# Patient Record
Sex: Female | Born: 1985 | Marital: Married | Smoking: Never smoker
Health system: Southern US, Community
[De-identification: ages and names within clinical notes are randomized; demographics above are authoritative.]

## PROBLEM LIST (undated history)

## (undated) ENCOUNTER — Inpatient Hospital Stay (HOSPITAL_COMMUNITY): Payer: Self-pay

## (undated) DIAGNOSIS — O24419 Gestational diabetes mellitus in pregnancy, unspecified control: Secondary | ICD-10-CM

## (undated) HISTORY — DX: Gestational diabetes mellitus in pregnancy, unspecified control: O24.419

## (undated) HISTORY — PX: NO PAST SURGERIES: SHX2092

---

## 2017-05-28 NOTE — L&D Delivery Note (Addendum)
Delivery Note At 4:29 AM a viable female was delivered via Vaginal, Spontaneous (Presentation: OA ).  APGAR: pending, ; weight pending.   Placenta status: intact.  Cord: 3 vessel  with the following complications: precipitous delivery .  Cord pH: not obtained  Anesthesia:  none Episiotomy: None Lacerations: None Suture Repair: none Est. Blood Loss (mL): 100  Mom to postpartum.  Baby to Couplet care / Skin to Skin.  Cassidy Gordon 12/01/2017, 4:40 AM

## 2017-05-30 ENCOUNTER — Ambulatory Visit: Payer: Self-pay

## 2017-06-05 ENCOUNTER — Ambulatory Visit (INDEPENDENT_AMBULATORY_CARE_PROVIDER_SITE_OTHER): Payer: PPO | Admitting: General Practice

## 2017-06-05 DIAGNOSIS — Z3201 Encounter for pregnancy test, result positive: Secondary | ICD-10-CM

## 2017-06-05 DIAGNOSIS — O219 Vomiting of pregnancy, unspecified: Secondary | ICD-10-CM

## 2017-06-05 LAB — POCT PREGNANCY, URINE: PREG TEST UR: POSITIVE — AB

## 2017-06-05 MED ORDER — RANITIDINE HCL 150 MG PO TABS: 150.0000 mg | ORAL_TABLET | Freq: Two times a day (BID) | ORAL | 0 refills | Status: DC

## 2017-06-05 MED ORDER — DOXYLAMINE-PYRIDOXINE 10-10 MG PO TBEC: DELAYED_RELEASE_TABLET | ORAL | 1 refills | Status: DC

## 2017-06-05 MED ORDER — PRENATAL VITAMINS 0.8 MG PO TABS: 1.0000 | ORAL_TABLET | Freq: Every day | ORAL | 12 refills | Status: DC

## 2017-06-05 NOTE — Progress Notes (Signed)
[employer] interpreter [phone] used for encounter. Patient here for UPT today. UPT +. Patient hasn't taken any tests at home. LMP 03/07/17 EDD 12/12/17 6469w6d. Patient denies taking medication, sometimes folic acid. Recommended she take PNV daily. Patient is requesting Rx for nausea & acid reflux. Per Dr Vergie LivingPickens, may prescribe zantac & diclegis. meds ordered. Informed patient & advised she start prenatal care. Patient verbalized understanding & has no questions

## 2017-06-24 ENCOUNTER — Ambulatory Visit (INDEPENDENT_AMBULATORY_CARE_PROVIDER_SITE_OTHER): Payer: PPO | Admitting: Advanced Practice Midwife

## 2017-06-24 ENCOUNTER — Encounter: Payer: Self-pay | Admitting: Advanced Practice Midwife

## 2017-06-24 DIAGNOSIS — Z8632 Personal history of gestational diabetes: Secondary | ICD-10-CM

## 2017-06-24 DIAGNOSIS — O09292 Supervision of pregnancy with other poor reproductive or obstetric history, second trimester: Secondary | ICD-10-CM

## 2017-06-24 DIAGNOSIS — O09299 Supervision of pregnancy with other poor reproductive or obstetric history, unspecified trimester: Secondary | ICD-10-CM

## 2017-06-24 DIAGNOSIS — O0990 Supervision of high risk pregnancy, unspecified, unspecified trimester: Secondary | ICD-10-CM

## 2017-06-24 DIAGNOSIS — O0992 Supervision of high risk pregnancy, unspecified, second trimester: Secondary | ICD-10-CM

## 2017-06-24 DIAGNOSIS — Z3480 Encounter for supervision of other normal pregnancy, unspecified trimester: Secondary | ICD-10-CM | POA: Insufficient documentation

## 2017-06-24 NOTE — Progress Notes (Signed)
  Subjective:    Cassidy Gordon is being seen today for her first obstetrical visit.  This is a planned pregnancy. She is at [redacted]w[redacted]d gestation. Her obstetrical history is significant for diet controlled GDM with prior pregnancy. . Relationship with FOB: spouse, living together. Patient does intend to breast feed. Pregnancy history fully reviewed.  Patient reports no complaints.  Last pap, unsure, prior to pregnancy in 2016.  First baby born in [place] in 11/2014, NSVD  Review of Systems:   Review of Systems  All other systems reviewed and are negative.   Objective:     BP 123/71   Wt 171 lb 8 oz (77.8 kg)   LMP 03/07/2017 (Exact Date)  Physical Exam  Nursing note and vitals reviewed. Constitutional: She is oriented to person, place, and time. She appears well-developed and well-nourished. No distress.  HENT:  Head: Normocephalic.  Cardiovascular: Normal rate.  Respiratory: Effort normal. Right breast exhibits no inverted nipple, no mass, no nipple discharge, no skin change and no tenderness. Left breast exhibits no inverted nipple, no mass, no nipple discharge, no skin change and no tenderness.  GI: Soft. There is no tenderness. There is no rebound.  Neurological: She is alert and oriented to person, place, and time.  Skin: Skin is warm and dry.  Psychiatric: She has a normal mood and affect.      Fetal Exam Fetal Monitor Review: Mode: hand-held doppler probe.   Baseline rate: 144.         Assessment:    Pregnancy: G2P0 Patient Active Problem List   Diagnosis Date Noted  . Supervision of other normal pregnancy, antepartum 06/24/2017  . History of gestational diabetes in prior pregnancy, currently pregnant 06/24/2017       Plan:     Initial labs drawn. Patient declines pap today. Would like to have it after she has the baby.  Prenatal vitamins. Early 1 hour GTT today (obesity and hx of GDM with prior pregnancy).  Problem list reviewed and  updated. Panorama discussed: requested AFP discussed: requested. Role of ultrasound in pregnancy discussed; fetal survey: requested. Scheduled for 07/11/16  Amniocentesis discussed: not indicated. Follow up in 4 weeks. 50% of 60 min visit spent on counseling and coordination of care.  [employer] lang int here for visit.    Thressa ShellerHeather Hogan 06/24/2017

## 2017-06-24 NOTE — Progress Notes (Signed)
Pt is Arabic did not offer Baby Scripts, Pt declined Flu shot.

## 2017-06-24 NOTE — Patient Instructions (Signed)
AREA PEDIATRIC/FAMILY PRACTICE PHYSICIANS  [facility] [address], [address] [place], [place]  [address] Phone - [phone]   Fax - [phone]  [facility] [address] [place], [place] [address] Phone - [phone]   Fax - [phone]  [facility] [address] [place], [place]  [address] Phone - [phone]   Fax - [phone]  [facility] [address] [place], [place]  [address] Phone - [phone]   Fax - [phone]  [facility] [address] [place], [place]  [address] Phone - [phone]   Fax - [phone]  [facility] [address] [place], [place]  [address] Phone - [phone]   Fax - [phone]  [facility] [address] [place], [place]  [address] Phone - [phone]   Fax - [phone]  [facility] [address], [address] [place], [place]  [address] Phone - [phone]   Fax - [phone]  [facility] [address] [place], [place]  [address] Phone - [phone]   Fax - [phone] [facility] [address] [place], [place]  [address] Phone - [phone]   Fax - [phone]  [facility] [address] [place], [place]  [address] Phone - [phone]   Fax - [phone]  [facility] [address] [place], [place]  [address] Phone - [phone]   Fax - [phone]  [facility] [address], [address] [place], [place]  [address] Phone - [phone]   Fax - [phone]  [facility] [address] [place], [place]  [address] Phone - [phone]  [facility] [address] [place], [place]  [address] Phone - [phone]   Fax - [phone]  [facility] [address] [place], [place]  [address] Phone - [phone]   Fax - [phone]  [facility] [address] [place], [place]  [address] Phone - [phone]   Fax - [phone]  [facility] [address] [place], [place]  [address] Phone - [phone]   Fax - [phone]   [facility] [address] [place], [place]  [address] Phone - [phone]   Fax - [phone]  [facility] [address] [place], [place]  [address] Phone - [phone]   Fax - [phone]  [facility] [address] [place], [place]  [address] Phone - [phone]   Fax - [phone]  [facility] [address] , [place]  [address] Phone - [phone]   Fax - [phone]  [facility] [address] , [place]  [address] Phone - [phone]   Fax - [phone] [facility] [address] [place]  [address] Phone - [phone]   Fax - [phone]  [facility] [address] [place], [place]  [address] Phone - [phone]   Fax - [phone]  [facility] [address] [place], [place]  [address] Phone - [phone]   Fax - [phone]  [facility] Charlene Flemming MD [address] [place]  [address] Phone [phone]  Fax [phone] Places to have your son circumcised:    [facility] [phone] while you are   in hospital  [facility] [phone] $[phone] by 4 wks  [facility] [phone] $[phone] by 2 wks  [facility] [phone] $[phone] by 7 days [facility] [phone] $[phone] by 4 wks  These prices sometimes change but are roughly what you can expect to pay. Please  call and confirm pricing.   Circumcision is considered an elective/non-medically necessary procedure. There are many reasons parents decide to have their sons circumsized. During the first year of life circumcised males have a reduced risk of urinary tract infections but after this year the rates between circumcised males and uncircumcised males are the same.  It is safe to have your son circumcised outside of the hospital and the places above perform them regularly.   Deciding about Circumcision in Baby Boys  (Up-to-date The Basics)  What is circumcision?  Circumcision is a surgery that removes the skin that covers the tip of the penis, called the "foreskin" Circumcision is usually done when a boy is between 1 and 10 days old. In the [place], circumcision is common. In some other countries, fewer boys are circumcised. Circumcision is a common tradition in some religions.  Should I have my baby boy circumcised?  There is no easy answer. Circumcision has some benefits. But it also has risks. After talking with your doctor, you will have to decide for yourself what is right for your family.  What are the benefits of circumcision?  Circumcised boys seem to have slightly lower rates of: ?Urinary tract infections ?Swelling of the opening at the tip of the penis Circumcised men seem to have slightly lower rates of: ?Urinary tract infections ?Swelling of the opening at the tip of the penis ?Penis cancer ?HIV and other infections that you catch during sex ?Cervical cancer in the women they have sex with Even so, in the [place], the risks of these problems are small - even in boys and men who have not been circumcised. Plus, boys and men who are not circumcised can reduce these extra risks by: ?Cleaning their penis well ?Using condoms during sex  What are the risks of circumcision?  Risks include: ?Bleeding or infection from the surgery ?Damage to or amputation of the  penis ?A chance that the doctor will cut off too much or not enough of the foreskin ?A chance that sex won't feel as good later in life Only about 1 out of every 200 circumcisions leads to problems. There is also a chance that your health insurance won't pay for circumcision.  How is circumcision done in baby boys?  First, the baby gets medicine for pain relief. This might be a cream on the skin or a shot into the base of the penis. Next, the doctor cleans the baby's penis well. Then he or she uses special tools to cut off the foreskin. Finally, the doctor wraps a bandage (called gauze) around the baby's penis. If you have your baby circumcised, his doctor or nurse will give you instructions on how to care for him after the surgery. It is important that you follow those instructions carefully.  

## 2017-06-26 LAB — CULTURE, OB URINE

## 2017-06-26 LAB — URINE CULTURE, OB REFLEX

## 2017-07-01 ENCOUNTER — Other Ambulatory Visit: Payer: Self-pay | Admitting: Advanced Practice Midwife

## 2017-07-01 DIAGNOSIS — Z3480 Encounter for supervision of other normal pregnancy, unspecified trimester: Secondary | ICD-10-CM

## 2017-07-01 LAB — AFP, SERUM, OPEN SPINA BIFIDA
AFP MOM: 2.69
AFP VALUE AFPOSL: 63.9 ng/mL
GEST. AGE ON COLLECTION DATE: 15 wk
Maternal Age At EDD: 32.8 yr
OSBR RISK 1 IN: 202
Test Results:: POSITIVE — AB
WEIGHT: 171 [lb_av]

## 2017-07-01 LAB — OBSTETRIC PANEL, INCLUDING HIV
ANTIBODY SCREEN: NEGATIVE
BASOS: 0 %
Basophils Absolute: 0 10*3/uL (ref 0.0–0.2)
EOS (ABSOLUTE): 0.1 10*3/uL (ref 0.0–0.4)
EOS: 1 %
HEMATOCRIT: 34.5 % (ref 34.0–46.6)
HEMOGLOBIN: 11.6 g/dL (ref 11.1–15.9)
HIV Screen 4th Generation wRfx: NONREACTIVE
Hepatitis B Surface Ag: NEGATIVE
IMMATURE GRANS (ABS): 0 10*3/uL (ref 0.0–0.1)
Immature Granulocytes: 0 %
LYMPHS: 29 %
Lymphocytes Absolute: 1.8 10*3/uL (ref 0.7–3.1)
MCH: 28.9 pg (ref 26.6–33.0)
MCHC: 33.6 g/dL (ref 31.5–35.7)
MCV: 86 fL (ref 79–97)
MONOS ABS: 0.2 10*3/uL (ref 0.1–0.9)
Monocytes: 4 %
NEUTROS ABS: 4.1 10*3/uL (ref 1.4–7.0)
Neutrophils: 66 %
Platelets: 288 10*3/uL (ref 150–379)
RBC: 4.01 x10E6/uL (ref 3.77–5.28)
RDW: 13.7 % (ref 12.3–15.4)
RH TYPE: POSITIVE
RPR Ser Ql: NONREACTIVE
Rubella Antibodies, IGG: 5.91 index (ref >0.99)
WBC: 6.3 10*3/uL (ref 3.4–10.8)

## 2017-07-01 LAB — SMN1 COPY NUMBER ANALYSIS (SMA CARRIER SCREENING)

## 2017-07-01 LAB — GLUCOSE TOLERANCE, 1 HOUR: Glucose, 1Hr PP: 149 mg/dL (ref 65–199)

## 2017-07-01 LAB — CYSTIC FIBROSIS GENE TEST

## 2017-07-05 ENCOUNTER — Encounter: Payer: Self-pay | Admitting: *Deleted

## 2017-07-11 ENCOUNTER — Other Ambulatory Visit: Payer: Self-pay | Admitting: Advanced Practice Midwife

## 2017-07-11 ENCOUNTER — Encounter (HOSPITAL_COMMUNITY): Payer: Self-pay

## 2017-07-11 ENCOUNTER — Ambulatory Visit (HOSPITAL_COMMUNITY): Payer: PPO

## 2017-07-11 ENCOUNTER — Ambulatory Visit (HOSPITAL_COMMUNITY)
Admission: RE | Admit: 2017-07-11 | Discharge: 2017-07-11 | Disposition: A | Discharge status: Home / Self Care | Payer: PPO | Source: Ambulatory Visit | Attending: Advanced Practice Midwife | Admitting: Advanced Practice Midwife

## 2017-07-11 DIAGNOSIS — Z3689 Encounter for other specified antenatal screening: Secondary | ICD-10-CM

## 2017-07-11 DIAGNOSIS — O99212 Obesity complicating pregnancy, second trimester: Secondary | ICD-10-CM

## 2017-07-11 DIAGNOSIS — Z8632 Personal history of gestational diabetes: Secondary | ICD-10-CM

## 2017-07-11 DIAGNOSIS — O0990 Supervision of high risk pregnancy, unspecified, unspecified trimester: Secondary | ICD-10-CM

## 2017-07-11 DIAGNOSIS — Z3480 Encounter for supervision of other normal pregnancy, unspecified trimester: Secondary | ICD-10-CM

## 2017-07-11 DIAGNOSIS — O09299 Supervision of pregnancy with other poor reproductive or obstetric history, unspecified trimester: Secondary | ICD-10-CM

## 2017-07-11 DIAGNOSIS — Z3A18 18 weeks gestation of pregnancy: Secondary | ICD-10-CM | POA: Diagnosis not present

## 2017-07-11 DIAGNOSIS — O0992 Supervision of high risk pregnancy, unspecified, second trimester: Secondary | ICD-10-CM | POA: Insufficient documentation

## 2017-07-11 DIAGNOSIS — O281 Abnormal biochemical finding on antenatal screening of mother: Secondary | ICD-10-CM | POA: Insufficient documentation

## 2017-07-15 ENCOUNTER — Telehealth: Payer: Self-pay | Admitting: General Practice

## 2017-07-15 NOTE — Telephone Encounter (Signed)
Per chart review, patient already has genetic counseling appt for tomorrow 2/19. Called patient with [employer] intepreter [phone], no answer- left message to call us back for results or come by the office

## 2017-07-15 NOTE — Telephone Encounter (Signed)
-----   Message from Armando ReichertHeather D Hogan, CNM sent at 07/01/2017  5:10 PM EST ----- Patient with abnormal early one hour. Needs to have 3 hour. Also AFP screen + for open neural tube defect. Will need to see genetic counselor.

## 2017-07-16 ENCOUNTER — Other Ambulatory Visit (HOSPITAL_COMMUNITY): Payer: Self-pay | Admitting: *Deleted

## 2017-07-16 DIAGNOSIS — Z362 Encounter for other antenatal screening follow-up: Secondary | ICD-10-CM

## 2017-07-18 ENCOUNTER — Encounter (HOSPITAL_COMMUNITY): Payer: Self-pay

## 2017-07-18 ENCOUNTER — Ambulatory Visit (HOSPITAL_COMMUNITY): Payer: PPO | Attending: Advanced Practice Midwife

## 2017-07-19 NOTE — Telephone Encounter (Signed)
Pt dnka with Dentistgenetic counselor. She is scheduled to return on 2/25. Note made on visit that she needs 3 hr and to see genetic counselor.

## 2017-07-22 ENCOUNTER — Ambulatory Visit (INDEPENDENT_AMBULATORY_CARE_PROVIDER_SITE_OTHER): Payer: PPO | Admitting: Advanced Practice Midwife

## 2017-07-22 ENCOUNTER — Encounter: Payer: Self-pay | Admitting: General Practice

## 2017-07-22 ENCOUNTER — Encounter: Payer: Self-pay | Admitting: Advanced Practice Midwife

## 2017-07-22 VITALS — BP 101/62 | HR 88 | Wt 175.9 lb

## 2017-07-22 DIAGNOSIS — O0990 Supervision of high risk pregnancy, unspecified, unspecified trimester: Secondary | ICD-10-CM

## 2017-07-22 DIAGNOSIS — O09299 Supervision of pregnancy with other poor reproductive or obstetric history, unspecified trimester: Secondary | ICD-10-CM

## 2017-07-22 DIAGNOSIS — Z8632 Personal history of gestational diabetes: Secondary | ICD-10-CM

## 2017-07-22 DIAGNOSIS — O285 Abnormal chromosomal and genetic finding on antenatal screening of mother: Secondary | ICD-10-CM

## 2017-07-22 MED ORDER — ASPIRIN 81 MG PO CHEW: 81.0000 mg | CHEWABLE_TABLET | Freq: Every day | ORAL | 11 refills | Status: DC

## 2017-07-22 NOTE — Progress Notes (Signed)
   PRENATAL VISIT NOTE  Subjective:  Cassidy Gordon is a 32 y.o. G2P1001 at 831w4d being seen today for ongoing prenatal care.  She is currently monitored for the following issues for this low-risk pregnancy and has Supervision of other normal pregnancy, antepartum; History of gestational diabetes in prior pregnancy, currently pregnant; and Abnormal chromosomal and genetic finding on antenatal screening mother on their problem list.  Patient reports no complaints.   . Vag. Bleeding: None.  Movement: Present. Denies leaking of fluid.   The following portions of the patient's history were reviewed and updated as appropriate: allergies, current medications, past family history, past medical history, past social history, past surgical history and problem list. Problem list updated.  Objective:   Vitals:   07/22/17 1534  BP: 101/62  Pulse: 88  Weight: 175 lb 14.4 oz (79.8 kg)    Fetal Status: Fetal Heart Rate (bpm): 146   Movement: Present     General:  Alert, oriented and cooperative. Patient is in no acute distress.  Skin: Skin is warm and dry. No rash noted.   Cardiovascular: Normal heart rate noted  Respiratory: Normal respiratory effort, no problems with respiration noted  Abdomen: Soft, gravid, appropriate for gestational age.  Pain/Pressure: Absent     Pelvic: Cervical exam deferred        Extremities: Normal range of motion.  Edema: None  Mental Status:  Normal mood and affect. Normal behavior. Normal judgment and thought content.   Assessment and Plan:  Pregnancy: G2P1001 at 1931w4d  1. Supervision of high risk pregnancy, antepartum - Consult to Maternal Fetal Care - Genetic counseling  - Abnormal early 1 hour: Needs 3 hour GTT - Start baby ASA to 36 weeks   Preterm labor symptoms and general obstetric precautions including but not limited to vaginal bleeding, contractions, leaking of fluid and fetal movement were reviewed in detail with the patient. Please refer to After  Visit Summary for other counseling recommendations.  Return in about 4 weeks (around 08/19/2017).   Thressa ShellerHeather Ciin Brazzel, CNM

## 2017-07-22 NOTE — Progress Notes (Signed)
[employer] interpreter Kenard GowerNuha Mohammad

## 2017-08-02 ENCOUNTER — Telehealth (HOSPITAL_COMMUNITY): Payer: Self-pay | Admitting: MS"

## 2017-08-02 NOTE — Telephone Encounter (Signed)
Left message for patient and partner through [facility] [employer] [phone] regarding upcoming appointment on 08/[facility]/17 at [facility]. Original appointment was scheduled to begin at 2:00, with genetic counseling following at 3:00 pm. Left message that genetic counseling appointment has been moved to 12:30 pm on same date so we would like patient to arrive for 12:30 pm appointment instead of 2:00 pm appointment that date.   Clydie BraunKaren Bryer Cozzolino 08/02/2017 10:44 AM

## 2017-08-15 ENCOUNTER — Ambulatory Visit (HOSPITAL_COMMUNITY)
Admission: RE | Admit: 2017-08-15 | Discharge: 2017-08-15 | Disposition: A | Discharge status: Home / Self Care | Payer: PPO | Source: Ambulatory Visit | Attending: Advanced Practice Midwife | Admitting: Advanced Practice Midwife

## 2017-08-15 ENCOUNTER — Ambulatory Visit (HOSPITAL_COMMUNITY): Payer: PPO

## 2017-08-15 ENCOUNTER — Encounter (HOSPITAL_COMMUNITY): Payer: PPO

## 2017-08-19 ENCOUNTER — Other Ambulatory Visit: Payer: PPO

## 2017-08-19 ENCOUNTER — Encounter: Payer: Self-pay | Admitting: Advanced Practice Midwife

## 2017-08-19 ENCOUNTER — Ambulatory Visit (INDEPENDENT_AMBULATORY_CARE_PROVIDER_SITE_OTHER): Payer: PPO | Admitting: Advanced Practice Midwife

## 2017-08-19 ENCOUNTER — Encounter: Payer: Self-pay | Admitting: Obstetrics & Gynecology

## 2017-08-19 VITALS — BP 88/63 | HR 91 | Wt 182.5 lb

## 2017-08-19 DIAGNOSIS — O0990 Supervision of high risk pregnancy, unspecified, unspecified trimester: Secondary | ICD-10-CM

## 2017-08-19 DIAGNOSIS — Z789 Other specified health status: Secondary | ICD-10-CM

## 2017-08-19 DIAGNOSIS — O285 Abnormal chromosomal and genetic finding on antenatal screening of mother: Secondary | ICD-10-CM

## 2017-08-19 NOTE — Progress Notes (Signed)
Interpreter [employer]

## 2017-08-19 NOTE — Progress Notes (Signed)
   PRENATAL VISIT NOTE  Subjective:  Cassidy Gordon is a 32 y.o. G2P1001 at 2163w4d being seen today for ongoing prenatal care.  She is currently monitored for the following issues for this high-risk pregnancy and has Supervision of other normal pregnancy, antepartum; History of gestational diabetes in prior pregnancy, currently pregnant; and Abnormal chromosomal and genetic finding on antenatal screening mother on their problem list.  Patient reports no complaints.  Contractions: Not present. Vag. Bleeding: None.  Movement: Present. Denies leaking of fluid.   The following portions of the patient's history were reviewed and updated as appropriate: allergies, current medications, past family history, past medical history, past social history, past surgical history and problem list. Problem list updated.  Objective:   Vitals:   08/19/17 1113  BP: (!) 88/63  Pulse: 91  Weight: 182 lb 8 oz (82.8 kg)    Fetal Status: Fetal Heart Rate (bpm): 144 Fundal Height: 24 cm Movement: Present     General:  Alert, oriented and cooperative. Patient is in no acute distress.  Skin: Skin is warm and dry. No rash noted.   Cardiovascular: Normal heart rate noted  Respiratory: Normal respiratory effort, no problems with respiration noted  Abdomen: Soft, gravid, appropriate for gestational age.  Pain/Pressure: Present     Pelvic: Cervical exam deferred        Extremities: Normal range of motion.  Edema: None  Mental Status:  Normal mood and affect. Normal behavior. Normal judgment and thought content.   Assessment and Plan:  Pregnancy: G2P1001 at 6763w4d  1. Abnormal chromosomal and genetic finding on antenatal screening mother - Patient has not kept her appts with MFM for FU US and with genetic counselor.   2. Supervision of high risk pregnancy, antepartum - Patient with limited compliance. Has not FU'd on abnormal early 1 hour GTT or with MFM/genetic counselor.  - Patient states that she did not  know about those appointments. Reviewed pt phone number. She shares this phone with her husband, and did not tell her about the appointments. She would like to reschedule these appointments  - Suspect that language barrier is contributing to the lack of keeping appointments. Will reschedule appts today, and give patient the information directly today. - Needs 2 hour GTT at next visit  - Hgb A1C today  Preterm labor symptoms and general obstetric precautions including but not limited to vaginal bleeding, contractions, leaking of fluid and fetal movement were reviewed in detail with the patient. Please refer to After Visit Summary for other counseling recommendations.  Return in about 1 month (around 09/16/2017).   Thressa ShellerHeather Tawnee Clegg, CNM

## 2017-08-19 NOTE — Progress Notes (Signed)
Rescheduled Genetic Counseling/MFM OB US appt for April 4th @ 1345.  Pt notified.

## 2017-08-20 LAB — HEMOGLOBIN A1C
Est. average glucose Bld gHb Est-mCnc: 105 mg/dL
HEMOGLOBIN A1C: 5.3 % (ref 4.8–5.6)

## 2017-08-29 ENCOUNTER — Ambulatory Visit (HOSPITAL_COMMUNITY)
Admission: RE | Admit: 2017-08-29 | Payer: PPO | Source: Ambulatory Visit | Attending: Maternal and Fetal Medicine | Admitting: Maternal and Fetal Medicine

## 2017-08-29 ENCOUNTER — Ambulatory Visit (HOSPITAL_COMMUNITY)
Admission: RE | Admit: 2017-08-29 | Discharge: 2017-08-29 | Disposition: A | Discharge status: Home / Self Care | Payer: PPO | Source: Ambulatory Visit | Attending: Advanced Practice Midwife | Admitting: Advanced Practice Midwife

## 2017-08-29 DIAGNOSIS — O285 Abnormal chromosomal and genetic finding on antenatal screening of mother: Secondary | ICD-10-CM

## 2017-08-30 ENCOUNTER — Encounter (HOSPITAL_COMMUNITY): Payer: Self-pay

## 2017-08-30 ENCOUNTER — Ambulatory Visit (HOSPITAL_COMMUNITY)
Admission: RE | Admit: 2017-08-30 | Discharge: 2017-08-30 | Disposition: A | Discharge status: Home / Self Care | Payer: PPO | Source: Ambulatory Visit | Attending: Advanced Practice Midwife | Admitting: Advanced Practice Midwife

## 2017-08-30 ENCOUNTER — Other Ambulatory Visit (HOSPITAL_COMMUNITY): Payer: Self-pay | Admitting: *Deleted

## 2017-08-30 ENCOUNTER — Other Ambulatory Visit (HOSPITAL_COMMUNITY): Payer: Self-pay | Admitting: Maternal and Fetal Medicine

## 2017-08-30 DIAGNOSIS — Z362 Encounter for other antenatal screening follow-up: Secondary | ICD-10-CM | POA: Insufficient documentation

## 2017-08-30 DIAGNOSIS — O359XX0 Maternal care for (suspected) fetal abnormality and damage, unspecified, not applicable or unspecified: Secondary | ICD-10-CM

## 2017-08-30 DIAGNOSIS — Z8632 Personal history of gestational diabetes: Secondary | ICD-10-CM

## 2017-08-30 DIAGNOSIS — Z3A25 25 weeks gestation of pregnancy: Secondary | ICD-10-CM | POA: Diagnosis not present

## 2017-08-30 DIAGNOSIS — O09292 Supervision of pregnancy with other poor reproductive or obstetric history, second trimester: Secondary | ICD-10-CM | POA: Diagnosis not present

## 2017-08-30 DIAGNOSIS — O99210 Obesity complicating pregnancy, unspecified trimester: Secondary | ICD-10-CM

## 2017-08-30 DIAGNOSIS — O99212 Obesity complicating pregnancy, second trimester: Secondary | ICD-10-CM | POA: Diagnosis not present

## 2017-08-30 DIAGNOSIS — O289 Unspecified abnormal findings on antenatal screening of mother: Secondary | ICD-10-CM | POA: Insufficient documentation

## 2017-08-30 NOTE — Progress Notes (Addendum)
Genetic Counseling  High-Risk Gestation Note  Appointment Date:  08/29/2017 Referred By: Tresea Mall, CNM Date of Birth:  May 24, 1986 Partner:  Lytle Michaels   Pregnancy History: [id number] Estimated Date of Delivery: 12/12/17 Estimated Gestational Age: 68w0dAttending: BSeward Meth MD   Cassidy Gordon her partner, Mr. 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 [employer] telephonic interpreter #[phone] Arabic/English interpretation for today's visit.     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   We reviewed open neural tube defects including: the typical multifactorial etiology and variable prognosis.  In addition, we discussed alternative explanations for an elevated MSAFP including: normal variation, twins, feto-maternal bleeding, a gestational dating error, abdominal wall defects, kidney differences, oligohydramnios, and placental problems.  We discussed that an unexplained elevation of MSAFP is associated with an increased risk for third trimester  complications including: prematurity, low birth weight, and pre-eclampsia.    We reviewed additional available screening and diagnostic options including detailed ultrasound and amniocentesis.  We discussed the risks, limitations, and benefits of each. They understand that ultrasound cannot rule out all birth defects or genetic syndromes.  However, she was counseled that ~90% of fetuses with open neural tube defects can be detected by detailed second trimester ultrasound, when well visualized.  Detailed ultrasound was performed on 07/11/17. Fetal spine, posterior fossa, and abdominal wall were visualized to be within normal limits. Fetal anatomic survey was incomplete, and limited fetal heart views were obtained at that date. Follow-up ultrasound was scheduled. Patient did not keep original ultrasound appointment and arrived too late for ultrasound today. Ultrasound is scheduled for 08/30/17.    Cassidy Gordon had noninvasive prenatal screening (NIPS) through her OB provider, [facility]  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  We spent time reviewing chromosomes and the methodology of NIPS. We discussed that NIPS is not diagnostic. We discussed that in the absence of ultrasound findings, the positive predictive value of this result is approximately 20%, but in the context of ultrasound findings that may be supportive of fetal 22q11.2 deletion, the positive predictive value of the result is much higher.  We reviewed that 22q11.2 deletion syndrome is the most common microdeletion syndrome, occurring in approximately 1 in every 2,000-4,000 individuals. The incidence is estimated to be as high as 1 in 687in children born with congenital heart disease. Additional names for the condition include DiGeorge syndrome or Velocardiofacial syndrome. We discussed that it is caused by a deletion of genes on the long arm of chromosome  22 (the q11.2 region of the chromosome). Common features of this condition include heart defects (76%), cleft  palate (76%), characteristic facial features, immune deficiency (77%), and intellectual disability (>90%). Less commonly, individuals with 22q11 deletion syndrome may also have an autoimmune disease, growth hormone deficiency, hearing loss, and psychiatric illness (20-30%). Symptoms may vary significantly from person to person, even among members of the same family.  Approximately 93% of individuals with 22q11.2 deletion syndrome have a de novo (occurring for the first time in that individual) deletion of the 22q11.2 region, and approximately 7% of individuals inherited the deletion from one parent, who also has a deletion of the q11.2 region of chromosome 22.  22q11.2 deletion syndrome is an autosomal dominant condition, meaning once a person has the condition, they have a 50% (1 in 2) chance with each pregnancy to pass on the chromosome 22 that has the deletion and also have a child with the condition.    This couple was counseled regarding the availability of confirmatory prenatal testing via amniocentesis.  We reviewed the risks, benefits, and limitations of this testing, including the ~1 in 111-552 risk for complications including preterm delivery (given the advanced gestational age).  We also discussed the option of postnatal testing for the baby via peripheral blood analysis.  The patient and her husband declined amniocentesis.  They stated that they would prefer to pursue postnatal testing.  We also discussed the option of the baby being evaluated by a medical geneticist at birth.  We reviewed that a medical geneticist can evaluate the baby for features of 22q11.2 deletion syndrome and coordinate care of the child if the diagnosis is confirmed.  Mr. Monda requested a copy of the NIPS report, which was provided to the couple. Fetal sex was not disclosed to the couple today. He was also  provided with information regarding patient resources on 22q deletion.   Both family histories were briefly reviewed and found to be noncontributory for birth defects, intellectual disability, and known genetic conditions. More detailed family history review was deferred at this time. Without further information regarding the provided family history, an accurate genetic risk cannot be calculated. Further genetic counseling is warranted if more information is obtained.  I counseled this couple for approximately 45 minutes regarding the above risks and available options.    Chipper Oman, MS,  Certified Genetic Counselor 08/30/2017

## 2017-09-18 ENCOUNTER — Encounter: Payer: Self-pay | Admitting: Obstetrics and Gynecology

## 2017-09-18 ENCOUNTER — Ambulatory Visit (INDEPENDENT_AMBULATORY_CARE_PROVIDER_SITE_OTHER): Payer: PPO | Admitting: Obstetrics and Gynecology

## 2017-09-18 ENCOUNTER — Other Ambulatory Visit: Payer: PPO

## 2017-09-18 VITALS — BP 97/74 | HR 87 | Wt 189.1 lb

## 2017-09-18 DIAGNOSIS — Z3480 Encounter for supervision of other normal pregnancy, unspecified trimester: Secondary | ICD-10-CM

## 2017-09-18 DIAGNOSIS — Z8632 Personal history of gestational diabetes: Secondary | ICD-10-CM

## 2017-09-18 DIAGNOSIS — Z23 Encounter for immunization: Secondary | ICD-10-CM | POA: Diagnosis not present

## 2017-09-18 DIAGNOSIS — O09292 Supervision of pregnancy with other poor reproductive or obstetric history, second trimester: Secondary | ICD-10-CM

## 2017-09-18 DIAGNOSIS — Z789 Other specified health status: Secondary | ICD-10-CM

## 2017-09-18 DIAGNOSIS — O285 Abnormal chromosomal and genetic finding on antenatal screening of mother: Secondary | ICD-10-CM

## 2017-09-18 DIAGNOSIS — O09299 Supervision of pregnancy with other poor reproductive or obstetric history, unspecified trimester: Secondary | ICD-10-CM

## 2017-09-18 DIAGNOSIS — Z603 Acculturation difficulty: Secondary | ICD-10-CM

## 2017-09-18 MED ORDER — FOLIC ACID 1 MG PO TABS: 1.0000 mg | ORAL_TABLET | Freq: Every day | ORAL | 3 refills | Status: DC

## 2017-09-18 NOTE — Progress Notes (Signed)
Patient left building and not available for 1 hour lab draw. Per discussion with Dr. Earlene Plateravis, will go ahead and draw 2 hr lab draw today and do 1 hr gtt next visit.

## 2017-09-18 NOTE — Progress Notes (Signed)
   PRENATAL VISIT NOTE  Subjective:  Cassidy Gordon is a 32 y.o. G2P1001 at 1445w6d being seen today for ongoing prenatal care.  She is currently monitored for the following issues for this high-risk pregnancy and has Supervision of other normal pregnancy, antepartum; History of gestational diabetes in prior pregnancy, currently pregnant; Abnormal chromosomal and genetic finding on antenatal screening mother; Language barrier; and [redacted] weeks gestation of pregnancy on their problem list.  Patient reports occasional contractions.  Contractions: Irregular. Vag. Bleeding: None.  Movement: Present. Denies leaking of fluid.   The following portions of the patient's history were reviewed and updated as appropriate: allergies, current medications, past family history, past medical history, past social history, past surgical history and problem list. Problem list updated.  Objective:   Vitals:   09/18/17 0912  BP: 97/74  Pulse: 87  Weight: 189 lb 1.6 oz (85.8 kg)    Fetal Status: Fetal Heart Rate (bpm): 143   Movement: Present     General:  Alert, oriented and cooperative. Patient is in no acute distress.  Skin: Skin is warm and dry. No rash noted.   Cardiovascular: Normal heart rate noted  Respiratory: Normal respiratory effort, no problems with respiration noted  Abdomen: Soft, gravid, appropriate for gestational age.  Pain/Pressure: Absent     Pelvic: Cervical exam deferred        Extremities: Normal range of motion.  Edema: Trace  Mental Status: Normal mood and affect. Normal behavior. Normal judgment and thought content.   Assessment and Plan:  Pregnancy: G2P1001 at 7145w6d  1. Language barrier Arabic interpretor used  2. 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 History of gestational diabetes in prior pregnancy, currently pregnant 2 hr GTT today Neg A1c last month  4. Supervision of other  normal pregnancy, antepartum Tdap today 3rd trimester labs today  Preterm labor symptoms and general obstetric precautions including but not limited to vaginal bleeding, contractions, leaking of fluid and fetal movement were reviewed in detail with the patient. Please refer to After Visit Summary for other counseling recommendations.  Return in about 2 weeks (around 10/02/2017) for OB visit (MD).  Future Appointments  Date Time Provider Department Center  09/27/2017  3:00 PM [facility] US 3 [facility]    Conan BowensKelly M Davis, MD

## 2017-09-18 NOTE — Addendum Note (Signed)
Addended by: Dory LarsenMCCANDLESS, Iyonnah Ferrante A on: 09/18/2017 09:47 AM   Modules accepted: Orders

## 2017-09-19 LAB — CBC
HEMOGLOBIN: 11.8 g/dL (ref 11.1–15.9)
Hematocrit: 36.3 % (ref 34.0–46.6)
MCH: 28.6 pg (ref 26.6–33.0)
MCHC: 32.5 g/dL (ref 31.5–35.7)
MCV: 88 fL (ref 79–97)
PLATELETS: 269 10*3/uL (ref 150–379)
RBC: 4.12 x10E6/uL (ref 3.77–5.28)
RDW: 14.4 % (ref 12.3–15.4)
WBC: 7.4 10*3/uL (ref 3.4–10.8)

## 2017-09-19 LAB — HIV ANTIBODY (ROUTINE TESTING W REFLEX): HIV SCREEN 4TH GENERATION: NONREACTIVE

## 2017-09-19 LAB — GLUCOSE TOLERANCE, 2 HOURS W/ 1HR
GLUCOSE, 2 HOUR: 178 mg/dL — AB (ref 65–152)
Glucose, Fasting: 86 mg/dL (ref 65–91)

## 2017-09-19 LAB — RPR: RPR Ser Ql: NONREACTIVE

## 2017-09-23 ENCOUNTER — Telehealth: Payer: Self-pay

## 2017-09-23 NOTE — Telephone Encounter (Signed)
-----   Message from Conan Bowens, MD sent at 09/19/2017  9:20 AM EDT ----- Positive result on 2 hr GTT, now gDM, please call and let patient know (she is Arabic speaking, husband speaks [place]) and get her set up with DM testing. She was set up to do a 1 hr at her next visit because she missed the 1 hr draw but no longer needs that.

## 2017-09-23 NOTE — Telephone Encounter (Signed)
LM for pt with Interpreter Clemens Catholic to call the office for results and that we have scheduled her an appt for (diabetes) education on May 2 @ 1100.

## 2017-09-26 ENCOUNTER — Other Ambulatory Visit: Payer: PPO

## 2017-09-27 ENCOUNTER — Ambulatory Visit (HOSPITAL_COMMUNITY)
Admission: RE | Admit: 2017-09-27 | Discharge: 2017-09-27 | Disposition: A | Discharge status: Home / Self Care | Payer: PPO | Source: Ambulatory Visit | Attending: Advanced Practice Midwife | Admitting: Advanced Practice Midwife

## 2017-09-27 ENCOUNTER — Encounter (HOSPITAL_COMMUNITY): Payer: Self-pay

## 2017-09-30 ENCOUNTER — Encounter: Payer: PPO | Admitting: Family Medicine

## 2017-10-14 NOTE — Telephone Encounter (Signed)
Pt DNKA on 5/2 for Diabetes Education. Pt has office appt this week on 5/24. She can be notified of Dx: GDM and of Diabetes Education appt scheduled on 5/28.

## 2017-10-18 ENCOUNTER — Encounter: Payer: PPO | Admitting: Obstetrics and Gynecology

## 2017-10-18 ENCOUNTER — Other Ambulatory Visit: Payer: Self-pay | Admitting: General Practice

## 2017-10-18 DIAGNOSIS — O24419 Gestational diabetes mellitus in pregnancy, unspecified control: Secondary | ICD-10-CM | POA: Insufficient documentation

## 2017-10-18 DIAGNOSIS — O219 Vomiting of pregnancy, unspecified: Secondary | ICD-10-CM

## 2017-10-18 MED ORDER — RANITIDINE HCL 150 MG PO TABS: 150.0000 mg | ORAL_TABLET | Freq: Two times a day (BID) | ORAL | 2 refills | Status: DC

## 2017-10-22 ENCOUNTER — Ambulatory Visit: Payer: PPO | Admitting: *Deleted

## 2017-10-22 ENCOUNTER — Encounter: Payer: PPO | Attending: Family Medicine | Admitting: *Deleted

## 2017-10-22 ENCOUNTER — Other Ambulatory Visit: Payer: Self-pay

## 2017-10-22 DIAGNOSIS — R7302 Impaired glucose tolerance (oral): Secondary | ICD-10-CM | POA: Insufficient documentation

## 2017-10-22 DIAGNOSIS — Z713 Dietary counseling and surveillance: Secondary | ICD-10-CM | POA: Diagnosis present

## 2017-10-22 MED ORDER — GLUCOSE BLOOD VI STRP: ORAL_STRIP | 12 refills | Status: DC

## 2017-10-22 MED ORDER — ONETOUCH VERIO W/DEVICE KIT: 1.0000 | PACK | Freq: Four times a day (QID) | 0 refills | Status: DC

## 2017-10-22 MED ORDER — ONETOUCH DELICA LANCETS 33G MISC: 1.0000 | Freq: Four times a day (QID) | 12 refills | Status: DC

## 2017-10-23 NOTE — Progress Notes (Signed)
  Patient was seen on 10/22/2017 for Gestational Diabetes self-management. EDD 12/12/2017. Patient speaks Arabic, live interpretor her for visit along with her husband who participated occasionally during the visit. Patient states history of GDM with last pregnancy. Diet history obtained. Patient eats fair variety of all food groups but she is only eating 2 meals a day during Ramadan at 8:30 PM and 4:30 AM. Beverages include water and fruit juices.  The following learning objectives were met by the patient :   States the definition of Gestational Diabetes  States why dietary management is important in controlling blood glucose  Describes the effects of carbohydrates on blood glucose levels  Demonstrates ability to create a balanced meal plan  Demonstrates carbohydrate counting   States when to check blood glucose levels  Demonstrates proper blood glucose monitoring techniques  States the effect of stress and exercise on blood glucose levels  States the importance of limiting caffeine and abstaining from alcohol and smoking  Plan:  Aim for 3 Carb Choices per meal (45 grams) +/- 1 either way  Aim for 1-2 Carbs per snack Begin reading food labels for Total Carbohydrate of foods Consider  increasing your activity level by walking or other activity daily as tolerated Begin checking BG before breakfast and 2 hours after first bite of breakfast, lunch and dinner as directed by MD  Bring Log Book/Sheet to every medical appointment   Patient not appropriate for Baby Scripts due to language barrier  Take medication if directed by MD  Blood glucose monitor Rx called into pharmacy: One Touch Verio which should be covered by her insurance company.  Patient instructed to test pre breakfast and 2 hours each meal as directed by MD  Patient instructed to monitor glucose levels: FBS: 60 - 95 mg/dl 2 hour: <120 mg/dl  Patient received the following handouts: in Arabic  Nutrition Diabetes and  Pregnancy  Carbohydrate Counting List  BG Log Sheet  Patient will be seen for follow-up as needed.

## 2017-11-01 ENCOUNTER — Ambulatory Visit (INDEPENDENT_AMBULATORY_CARE_PROVIDER_SITE_OTHER): Payer: PPO | Admitting: Obstetrics and Gynecology

## 2017-11-01 ENCOUNTER — Encounter: Payer: Self-pay | Admitting: Obstetrics and Gynecology

## 2017-11-01 VITALS — BP 112/74 | HR 89 | Wt 195.1 lb

## 2017-11-01 DIAGNOSIS — O285 Abnormal chromosomal and genetic finding on antenatal screening of mother: Secondary | ICD-10-CM

## 2017-11-01 DIAGNOSIS — Z789 Other specified health status: Secondary | ICD-10-CM

## 2017-11-01 DIAGNOSIS — Z3480 Encounter for supervision of other normal pregnancy, unspecified trimester: Secondary | ICD-10-CM

## 2017-11-01 DIAGNOSIS — O24419 Gestational diabetes mellitus in pregnancy, unspecified control: Secondary | ICD-10-CM

## 2017-11-01 NOTE — Progress Notes (Signed)
Subjective:  Cassidy Gordon is a 32 y.o. G2P1001 at 2084w1d being seen today for ongoing prenatal care.  She is currently monitored for the following issues for this high-risk pregnancy and has Supervision of other normal pregnancy, antepartum; History of gestational diabetes in prior pregnancy, currently pregnant; Abnormal chromosomal and genetic finding on antenatal screening mother; Language barrier; and Gestational diabetes mellitus (GDM) affecting pregnancy, antepartum on their problem list.  Patient reports no complaints.  Contractions: Irregular. Vag. Bleeding: None.  Movement: Present. Denies leaking of fluid.   The following portions of the patient's history were reviewed and updated as appropriate: allergies, current medications, past family history, past medical history, past social history, past surgical history and problem list. Problem list updated.  Objective:   Vitals:   11/01/17 0916  BP: 112/74  Pulse: 89  Weight: 195 lb 1.6 oz (88.5 kg)    Fetal Status: Fetal Heart Rate (bpm): 137   Movement: Present     General:  Alert, oriented and cooperative. Patient is in no acute distress.  Skin: Skin is warm and dry. No rash noted.   Cardiovascular: Normal heart rate noted  Respiratory: Normal respiratory effort, no problems with respiration noted  Abdomen: Soft, gravid, appropriate for gestational age. Pain/Pressure: Absent     Pelvic:  Cervical exam deferred        Extremities: Normal range of motion.  Edema: Trace  Mental Status: Normal mood and affect. Normal behavior. Normal judgment and thought content.   Urinalysis:      Assessment and Plan:  Pregnancy: G2P1001 at 7384w1d  1. Supervision of other normal pregnancy, antepartum Stable  2. Gestational diabetes mellitus (GDM) affecting pregnancy, antepartum Just got glucose monitor yesterday. Has not started checking BS. Importance of following diet and checking CBG's reviewed with pt. Pt instructed to bring CBG  readings to all OB visits - US MFM OB FOLLOW UP; Future  3. Abnormal chromosomal and genetic finding on antenatal screening mother  - US MFM OB FOLLOW UP; Future  4. Language barrier Interrupter used during today's visit  Preterm labor symptoms and general obstetric precautions including but not limited to vaginal bleeding, contractions, leaking of fluid and fetal movement were reviewed in detail with the patient. Please refer to After Visit Summary for other counseling recommendations.  Return in about 1 week (around 11/08/2017) for OB visit.   Hermina StaggersErvin, Akanksha Bellmore L, MD

## 2017-11-04 ENCOUNTER — Ambulatory Visit (HOSPITAL_COMMUNITY): Admission: RE | Admit: 2017-11-04 | Payer: PPO | Source: Ambulatory Visit

## 2017-11-13 ENCOUNTER — Other Ambulatory Visit: Payer: Self-pay | Admitting: Obstetrics and Gynecology

## 2017-11-13 ENCOUNTER — Ambulatory Visit (HOSPITAL_COMMUNITY)
Admission: RE | Admit: 2017-11-13 | Discharge: 2017-11-13 | Disposition: A | Discharge status: Home / Self Care | Payer: PPO | Source: Ambulatory Visit | Attending: Obstetrics and Gynecology | Admitting: Obstetrics and Gynecology

## 2017-11-13 ENCOUNTER — Encounter (HOSPITAL_COMMUNITY): Payer: Self-pay

## 2017-11-13 DIAGNOSIS — O285 Abnormal chromosomal and genetic finding on antenatal screening of mother: Secondary | ICD-10-CM | POA: Diagnosis not present

## 2017-11-13 DIAGNOSIS — O24419 Gestational diabetes mellitus in pregnancy, unspecified control: Secondary | ICD-10-CM

## 2017-11-13 DIAGNOSIS — Z3A35 35 weeks gestation of pregnancy: Secondary | ICD-10-CM

## 2017-11-13 DIAGNOSIS — O99213 Obesity complicating pregnancy, third trimester: Secondary | ICD-10-CM

## 2017-11-13 DIAGNOSIS — Z362 Encounter for other antenatal screening follow-up: Secondary | ICD-10-CM

## 2017-11-13 DIAGNOSIS — O09299 Supervision of pregnancy with other poor reproductive or obstetric history, unspecified trimester: Secondary | ICD-10-CM

## 2017-11-13 DIAGNOSIS — Z8632 Personal history of gestational diabetes: Secondary | ICD-10-CM

## 2017-11-13 DIAGNOSIS — Z3480 Encounter for supervision of other normal pregnancy, unspecified trimester: Secondary | ICD-10-CM

## 2017-11-13 IMAGING — US US MFM OB FOLLOW-UP
2 series · 13 of 28 positions shown · non-contrast
Comparison: none

[Series 1: us mfm ob follow-up · 27 acquisitions, 8 frames shown (1 of 2)]
[im 2/27]
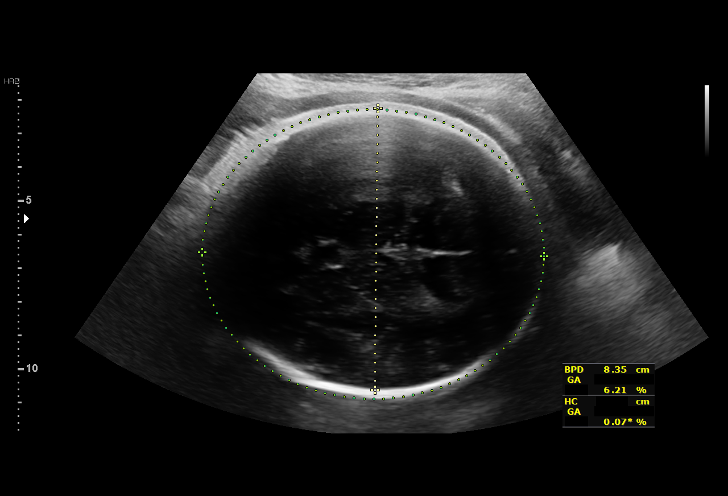
[im 5/27]
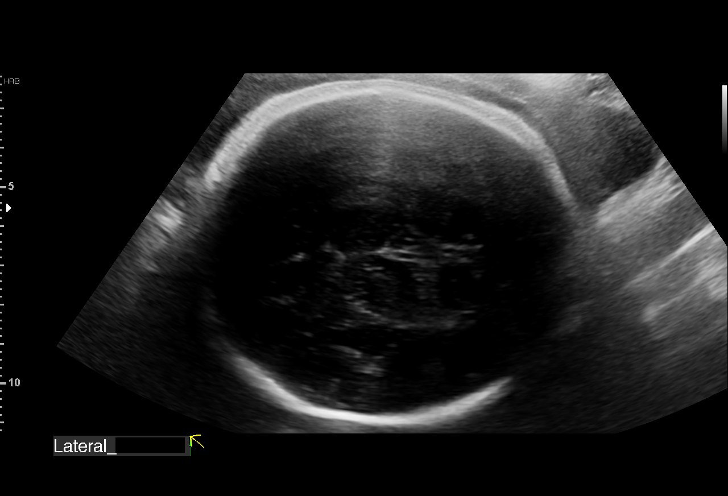
[im 9/27]
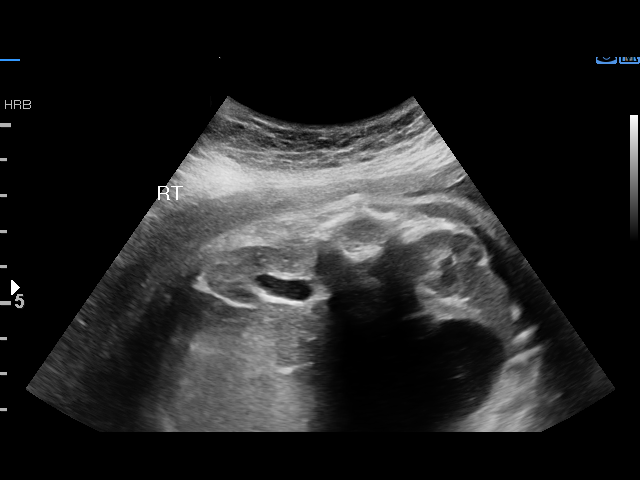
[im 12/27]
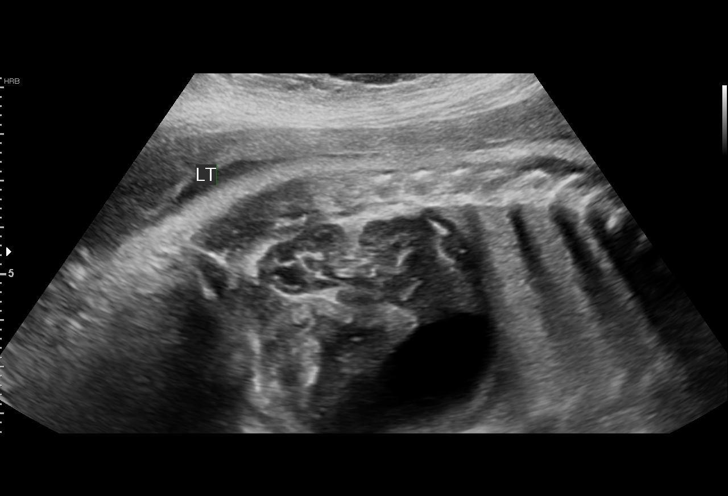
[im 15/27]
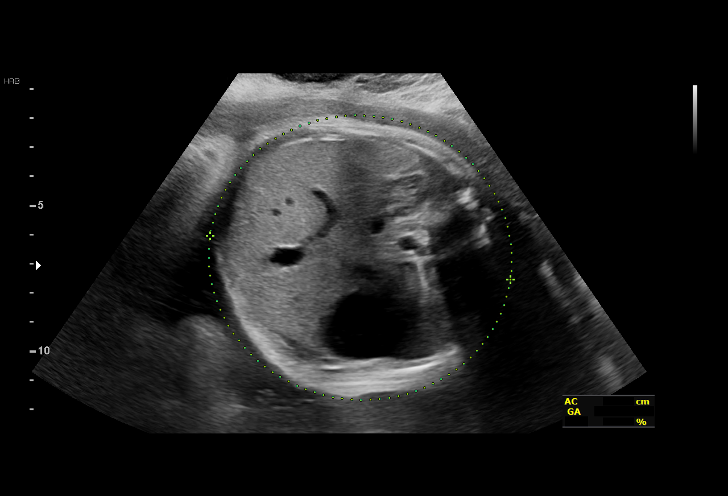
[im 18/27]
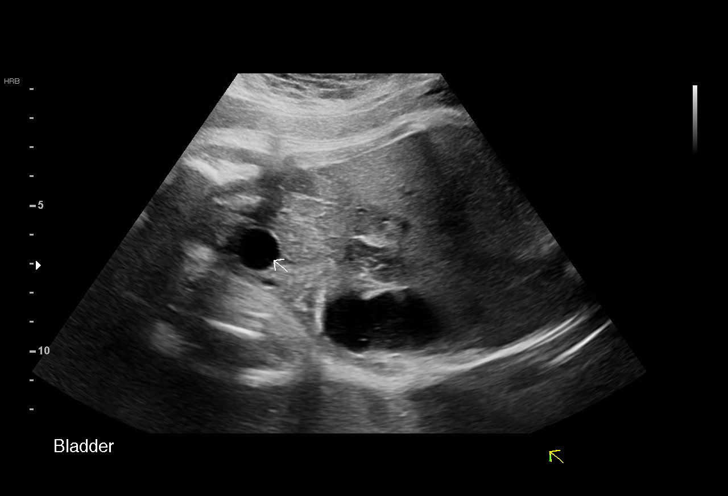
[im 23/27]
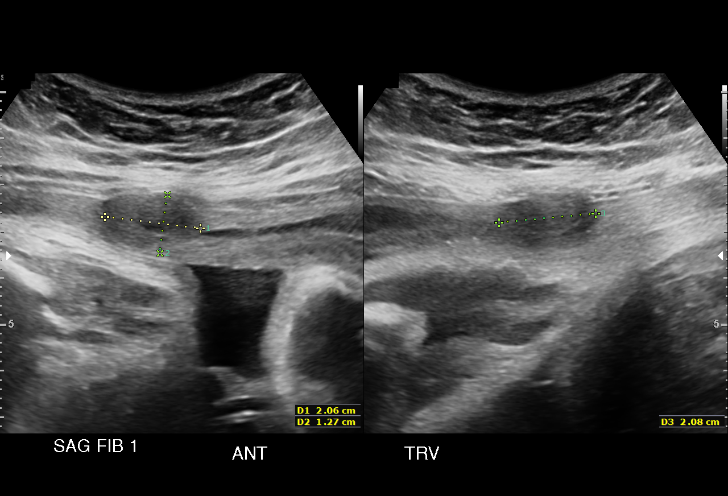
[im 27/27]
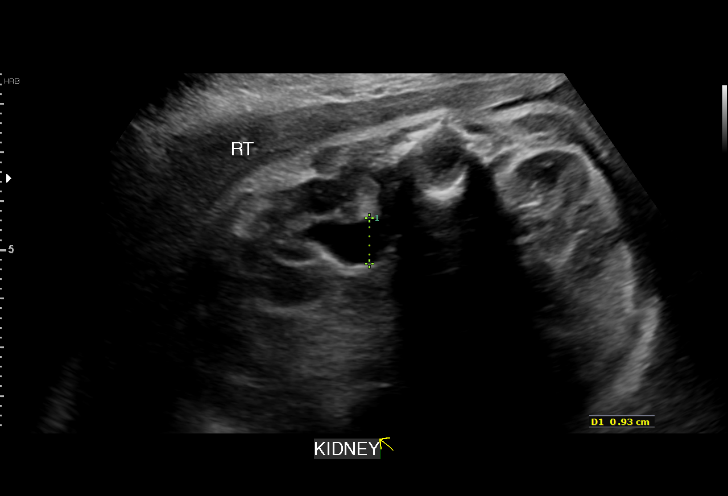

[Series 2: us mfm ob follow-up · 18 acquisitions, 5 frames shown (2 of 2)]
[im 2/18]
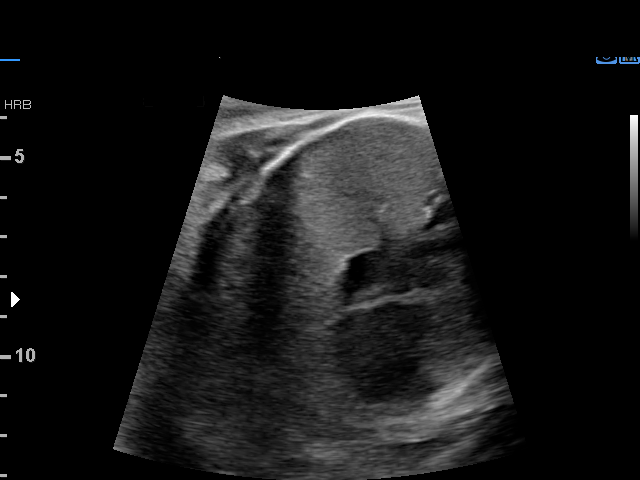
[im 6/18]
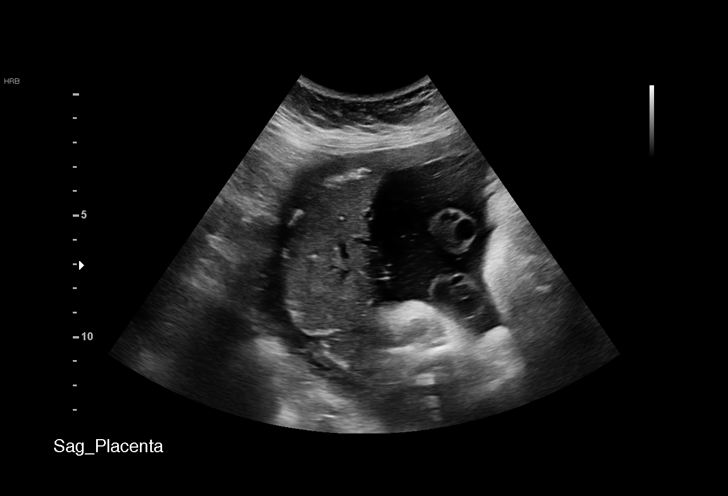
[im 9/18]
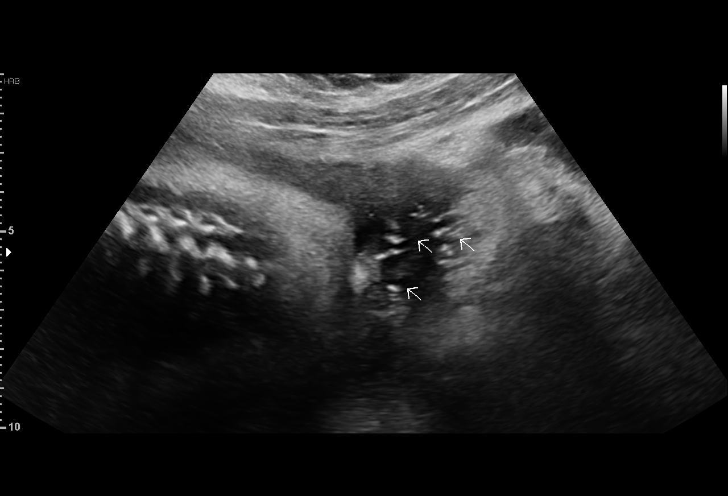
[im 12/18]
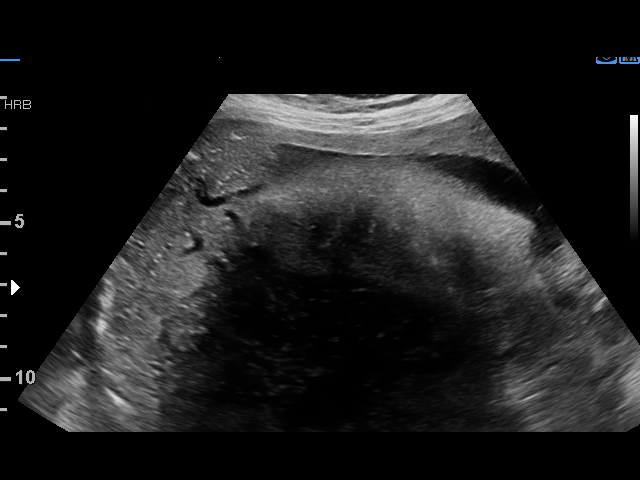
[im 16/18]
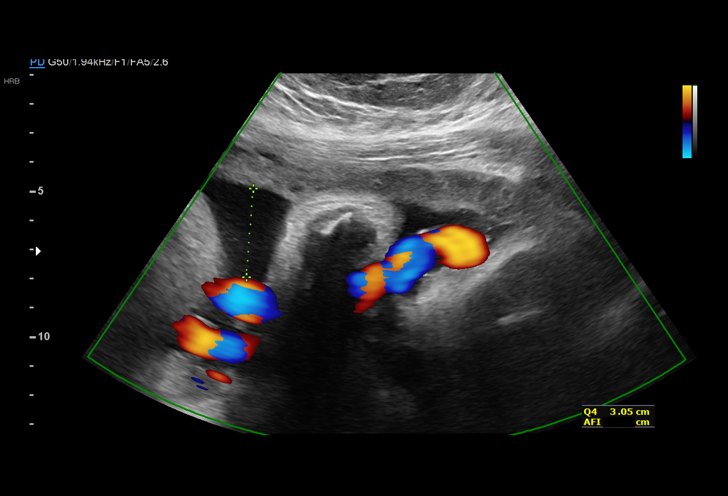

[13 of 28 positions shown; findings below may reference images not displayed]

[REDACTED]. [HOSPITAL],

1  [facility]            [PHONE_NUMBER]      [PHONE_NUMBER]     [PHONE_NUMBER]
Indications

35 weeks gestation of pregnancy
Poor obstetric history: Previous gestational   [VV]
diabetes
Obesity complicating pregnancy, third          [VV]
trimester
Abnormal biochemical screen (MSAFP MoM         [VV]
2.69, increased ONTD) (NIPS: 22q [DATE]
deletion syndrome)
Encounter for other antenatal screening        [VV]
follow-up
Gestational diabetes in pregnancy, diet        [VV]
controlled
OB History

Blood Type:            Height:  5'1"   Weight (lb):  171       BMI:
Gravidity:    2         Term:   1        Prem:   0
TOP:          0       Ectopic:  0        Living: 1
Fetal Evaluation

Num Of Fetuses:     1
Fetal Heart         144
Rate(bpm):
Cardiac Activity:   Observed
Presentation:       Cephalic
Placenta:           Posterior, above cervical os
P. Cord Insertion:  Previously Visualized

Amniotic Fluid
AFI FV:      Subjectively within normal limits

AFI Sum(cm)     %Tile       Largest Pocket(cm)
10.83           28
RUQ(cm)       RLQ(cm)       LUQ(cm)        LLQ(cm)
3.1
Biometry

BPD:      83.7  mm     G. Age:  33w 5d          7  %    CI:        80.23   %    70 - 86
FL/HC:      23.0   %    20.1 -
HC:      295.2  mm     G. Age:  32w 4d        < 3  %    HC/AC:      0.93        0.93 -
AC:      318.6  mm     G. Age:  35w 5d         57  %    FL/BPD:     81.1   %    71 - 87
FL:       67.9  mm     G. Age:  34w 6d         22  %    FL/AC:      21.3   %    20 - 24
HUM:      60.3  mm     G. Age:  35w 0d         46  %

Est. FW:    [VV]  gm    5 lb 10 oz      43  %
Gestational Age

LMP:           35w 6d        Date:  [DATE]                 EDD:   [DATE]
U/S Today:     34w 2d                                        EDD:   [DATE]
Best:          35w 6d     Det. By:  LMP  ([DATE])          EDD:   [DATE]
Anatomy

Cranium:               Appears normal         Aortic Arch:            Appears normal
Cavum:                 Previously seen        Ductal Arch:            Not well visualized
Ventricles:            Appears normal         Diaphragm:              Previously seen
Choroid Plexus:        Previously seen        Stomach:                Appears normal, left
sided
Cerebellum:            Previously seen        Abdomen:                Previously seen
Posterior Fossa:       Previously seen        Abdominal Wall:         Previously seen
Nuchal Fold:           Previously seen        Cord Vessels:           Previously seen
Face:                  Profile nl; orbits     Kidneys:                Right sided
prev. seen
pyelectasis,
9.3mm
Lips:                  Previously seen        Bladder:                Appears normal
Thoracic:              Appears normal         Spine:                  Limited views
previously seen
Heart:                 Previously seen        Upper Extremities:      Appears normal
RVOT:                  Previously seen        Lower Extremities:      Previously seen
LVOT:                  Appears normal

Other:  Parents do not wish to know sex of fetus. Fetus appears to be a
female. Heels and 5th digit visualized previously. Technically difficult
due to fetal position.
Cervix Uterus Adnexa

Cervix
Not visualized (advanced GA >[VV])
Comments

The head circumference measures <3%ile (Between 1 and 2
SD below the mean) but does not meet criteria for
microcephaly (=>3SD below the mean for gestational age).
Impression

Single living intrauterine pregnancy at 35w 6d.
Placenta Posterior, above cervical os.
Appropriate fetal growth.
Normal amniotic fluid volume.
The right renal pelvis measures 9.3mm consistent with
urinary tract dilation class A1. No peripheral calyceal dilation,
ureteral dilation, ureterocele or renal cortical thinning or
echogenicity is visualized. The left kidney and bladder appear
normal.
Otherwise normal fetal anatomy.
No additional fetal anomalies or soft markers of aneuploidy
seen.
Recommendations

Postnatal renal evaluation.
Postnatal echocardiogram (high risk for [VV] deletion by
cfDNA).
Delivery between 39 and 40 weeks, sooner as indicated.

## 2017-11-13 NOTE — ED Notes (Signed)
Interpreter with patient from Language resources.

## 2017-11-15 ENCOUNTER — Encounter: Payer: PPO | Admitting: Obstetrics and Gynecology

## 2017-11-20 ENCOUNTER — Telehealth: Payer: Self-pay | Admitting: Obstetrics and Gynecology

## 2017-11-20 ENCOUNTER — Encounter: Payer: PPO | Admitting: Obstetrics and Gynecology

## 2017-11-20 NOTE — Telephone Encounter (Signed)
Pt spouse called to cancel 8:55 HOB appt today. Pt is concerned she needs to be seen by the end of the week. Informed pt spouse no other appts available. Pt spouse insist we check with provider for a work in appt.

## 2017-11-25 ENCOUNTER — Encounter (HOSPITAL_COMMUNITY): Payer: Self-pay | Admitting: *Deleted

## 2017-11-25 ENCOUNTER — Inpatient Hospital Stay (HOSPITAL_COMMUNITY)
Admission: AD | Admit: 2017-11-25 | Discharge: 2017-11-26 | Disposition: A | Discharge status: Home / Self Care | Payer: PPO | Source: Ambulatory Visit | Attending: Obstetrics & Gynecology | Admitting: Obstetrics & Gynecology

## 2017-11-25 ENCOUNTER — Telehealth: Payer: Self-pay | Admitting: Family Medicine

## 2017-11-25 ENCOUNTER — Inpatient Hospital Stay (HOSPITAL_BASED_OUTPATIENT_CLINIC_OR_DEPARTMENT_OTHER): Payer: PPO

## 2017-11-25 DIAGNOSIS — O24410 Gestational diabetes mellitus in pregnancy, diet controlled: Secondary | ICD-10-CM | POA: Diagnosis not present

## 2017-11-25 DIAGNOSIS — N898 Other specified noninflammatory disorders of vagina: Secondary | ICD-10-CM | POA: Diagnosis present

## 2017-11-25 DIAGNOSIS — O289 Unspecified abnormal findings on antenatal screening of mother: Secondary | ICD-10-CM | POA: Diagnosis not present

## 2017-11-25 DIAGNOSIS — O4290 Premature rupture of membranes, unspecified as to length of time between rupture and onset of labor, unspecified weeks of gestation: Secondary | ICD-10-CM

## 2017-11-25 DIAGNOSIS — O26893 Other specified pregnancy related conditions, third trimester: Secondary | ICD-10-CM | POA: Diagnosis present

## 2017-11-25 DIAGNOSIS — Z3480 Encounter for supervision of other normal pregnancy, unspecified trimester: Secondary | ICD-10-CM

## 2017-11-25 DIAGNOSIS — O24419 Gestational diabetes mellitus in pregnancy, unspecified control: Secondary | ICD-10-CM

## 2017-11-25 DIAGNOSIS — Z0371 Encounter for suspected problem with amniotic cavity and membrane ruled out: Secondary | ICD-10-CM

## 2017-11-25 DIAGNOSIS — O09299 Supervision of pregnancy with other poor reproductive or obstetric history, unspecified trimester: Secondary | ICD-10-CM

## 2017-11-25 DIAGNOSIS — O4292 Full-term premature rupture of membranes, unspecified as to length of time between rupture and onset of labor: Secondary | ICD-10-CM | POA: Diagnosis not present

## 2017-11-25 DIAGNOSIS — O285 Abnormal chromosomal and genetic finding on antenatal screening of mother: Secondary | ICD-10-CM

## 2017-11-25 DIAGNOSIS — Z8632 Personal history of gestational diabetes: Secondary | ICD-10-CM

## 2017-11-25 DIAGNOSIS — Z3A37 37 weeks gestation of pregnancy: Secondary | ICD-10-CM | POA: Diagnosis not present

## 2017-11-25 LAB — OB RESULTS CONSOLE GBS: GBS: NEGATIVE

## 2017-11-25 LAB — AMNISURE RUPTURE OF MEMBRANE (ROM) NOT AT ARMC: Amnisure ROM: POSITIVE

## 2017-11-25 NOTE — MAU Provider Note (Addendum)
First Provider Initiated Contact with Patient 11/25/17 2210       S: Ms. Cassidy Gordon is a 32 y.o. G2P1001 at 4460w4d  who presents to MAU today complaining of leaking of fluid since 1 week ago.  This is enough to wet her underwear and she wears a pad but these are not soaked.  She denies vaginal bleeding. She denies contractions. She reports normal fetal movement.    O: BP 107/63 (BP Location: Right Arm)   Pulse 91   Temp 98.1 F (36.7 C) (Oral)   Resp 20   Ht 5' 0.5" (1.537 m)   Wt 204 lb 4 oz (92.6 kg)   LMP 03/07/2017 (Exact Date)   BMI 39.23 kg/m  GENERAL: Well-developed, well-nourished female in no acute distress.  HEAD: Normocephalic, atraumatic.  CHEST: Normal effort of breathing, regular heart rate ABDOMEN: Soft, nontender, gravid PELVIC: Normal external female genitalia. Vagina is pink and rugated. Cervix with normal contour, no lesions. Normal discharge.  negative pooling.   Cervical exam:  Dilation: 4.5 Effacement (%): 50 Station: -1 Presentation: Vertex Exam by:: LSA, CNM   Fetal Monitoring: Baseline: 140 Variability: moderate Accelerations: present Decelerations: none Contractions: irregular, mild to palpation  No results found for this or any previous visit (from the past 24 hour(s)).   A/P: SIUP at 8660w4d  Amnisure pending.  If negative, no evidence of ROM.  Pt denies contractions, is 4.5 cm but 50% and firm, no evidence of active labor.    Offered pt recheck of cervix in 1 hour for labor evaluation but she declined, reporting she does not have labor contractions and only wanted to find out if her water was broken  Friable cervix to cotton swab, no bleeding prior to exam, pink/light red on cotton swab and noted on cervical exam only.   D/C home with labor and bleeding precautions F/U in office as scheduled 12/02/17    Hurshel PartyLeftwich-Kirby, Lisa A, CNM 11/25/2017 10:10 PM   Amnisure returned with a positive result. Pt still with dry perineum however.  She had an U/S on 11/13/17 with an AFI of 10.8cm. U/S obtained tonight in order to compare: AFI 10.2cm, cephalic. Will continue with plan to d/c home with return for s/s labor/ROM/bldg. Keep OB appt on 12/02/17 otherwise.  Cam HaiSHAW, KIMBERLY CNM 11/26/2017 12:37 AM

## 2017-11-25 NOTE — MAU Note (Signed)
WITH STRATUS INTERPRETER -DALIA -  SAYS  SHE HAS HAD WETNESS ON HER UNDERWEAR  X1 WEEK SHE THINKS- CALLED  DR TODAY - TOLD  TO COME HERE. ON ARRIVAL- PERINEUM DRY.    SAYS TODAY DRY BUT OTHER DAYS  WET.    FEELS SOME UC.  NO VE IN CLINIC .   DENIES HSV AND MRSA. Marland Kitchen. GBS- UNSURE.

## 2017-11-25 NOTE — Telephone Encounter (Signed)
Patient's husband husband called to make an appointment. He stated his wife said she had a lot of water when she went to restroom, and now feels pain when she sleeps. Scheduled an appointment, and advised him to bring her to hospital for evaluation.

## 2017-11-26 DIAGNOSIS — O4292 Full-term premature rupture of membranes, unspecified as to length of time between rupture and onset of labor: Secondary | ICD-10-CM

## 2017-11-26 DIAGNOSIS — Z3A37 37 weeks gestation of pregnancy: Secondary | ICD-10-CM

## 2017-11-26 NOTE — Discharge Instructions (Signed)
Braxton Hicks Contractions °Contractions of the uterus can occur throughout pregnancy, but they are not always a sign that you are in labor. You may have practice contractions called Braxton Hicks contractions. These false labor contractions are sometimes confused with true labor. °What are Braxton Hicks contractions? °Braxton Hicks contractions are tightening movements that occur in the muscles of the uterus before labor. Unlike true labor contractions, these contractions do not result in opening (dilation) and thinning of the cervix. Toward the end of pregnancy (32-34 weeks), Braxton Hicks contractions can happen more often and may become stronger. These contractions are sometimes difficult to tell apart from true labor because they can be very uncomfortable. You should not feel embarrassed if you go to the hospital with false labor. °Sometimes, the only way to tell if you are in true labor is for your health care provider to look for changes in the cervix. The health care provider will do a physical exam and may monitor your contractions. If you are not in true labor, the exam should show that your cervix is not dilating and your water has not broken. °If there are other health problems associated with your pregnancy, it is completely safe for you to be sent home with false labor. You may continue to have Braxton Hicks contractions until you go into true labor. °How to tell the difference between true labor and false labor °True labor °· Contractions last 30-70 seconds. °· Contractions become very regular. °· Discomfort is usually felt in the top of the uterus, and it spreads to the lower abdomen and low back. °· Contractions do not go away with walking. °· Contractions usually become more intense and increase in frequency. °· The cervix dilates and gets thinner. °False labor °· Contractions are usually shorter and not as strong as true labor contractions. °· Contractions are usually irregular. °· Contractions  are often felt in the front of the lower abdomen and in the groin. °· Contractions may go away when you walk around or change positions while lying down. °· Contractions get weaker and are shorter-lasting as time goes on. °· The cervix usually does not dilate or become thin. °Follow these instructions at home: °· Take over-the-counter and prescription medicines only as told by your health care provider. °· Keep up with your usual exercises and follow other instructions from your health care provider. °· Eat and drink lightly if you think you are going into labor. °· If Braxton Hicks contractions are making you uncomfortable: °? Change your position from lying down or resting to walking, or change from walking to resting. °? Sit and rest in a tub of warm water. °? Drink enough fluid to keep your urine pale yellow. Dehydration may cause these contractions. °? Do slow and deep breathing several times an hour. °· Keep all follow-up prenatal visits as told by your health care provider. This is important. °Contact a health care provider if: °· You have a fever. °· You have continuous pain in your abdomen. °Get help right away if: °· Your contractions become stronger, more regular, and closer together. °· You have fluid leaking or gushing from your vagina. °· You pass blood-tinged mucus (bloody show). °· You have bleeding from your vagina. °· You have low back pain that you never had before. °· You feel your baby’s head pushing down and causing pelvic pressure. °· Your baby is not moving inside you as much as it used to. °Summary °· Contractions that occur before labor are called Braxton   Hicks contractions, false labor, or practice contractions. °· Braxton Hicks contractions are usually shorter, weaker, farther apart, and less regular than true labor contractions. True labor contractions usually become progressively stronger and regular and they become more frequent. °· Manage discomfort from Braxton Hicks contractions by  changing position, resting in a warm bath, drinking plenty of water, or practicing deep breathing. °This information is not intended to replace advice given to you by your health care provider. Make sure you discuss any questions you have with your health care provider. °Document Released: 09/27/2016 Document Revised: 09/27/2016 Document Reviewed: 09/27/2016 °Elsevier Interactive Patient Education © 2018 [employer] ° °

## 2017-11-28 LAB — CULTURE, BETA STREP (GROUP B ONLY)

## 2017-12-01 ENCOUNTER — Inpatient Hospital Stay (HOSPITAL_COMMUNITY)
Admission: AD | Admit: 2017-12-01 | Discharge: 2017-12-03 | DRG: 806 | Disposition: A | Discharge status: Home / Self Care | Payer: PPO | Attending: Obstetrics and Gynecology | Admitting: Obstetrics and Gynecology

## 2017-12-01 ENCOUNTER — Encounter (HOSPITAL_COMMUNITY): Payer: Self-pay | Admitting: General Practice

## 2017-12-01 DIAGNOSIS — Z3480 Encounter for supervision of other normal pregnancy, unspecified trimester: Secondary | ICD-10-CM

## 2017-12-01 DIAGNOSIS — Z3A38 38 weeks gestation of pregnancy: Secondary | ICD-10-CM

## 2017-12-01 DIAGNOSIS — O358XX0 Maternal care for other (suspected) fetal abnormality and damage, not applicable or unspecified: Secondary | ICD-10-CM | POA: Diagnosis present

## 2017-12-01 DIAGNOSIS — Z789 Other specified health status: Secondary | ICD-10-CM | POA: Diagnosis present

## 2017-12-01 DIAGNOSIS — O09299 Supervision of pregnancy with other poor reproductive or obstetric history, unspecified trimester: Secondary | ICD-10-CM

## 2017-12-01 DIAGNOSIS — O24420 Gestational diabetes mellitus in childbirth, diet controlled: Secondary | ICD-10-CM | POA: Diagnosis present

## 2017-12-01 DIAGNOSIS — O24419 Gestational diabetes mellitus in pregnancy, unspecified control: Secondary | ICD-10-CM | POA: Diagnosis present

## 2017-12-01 DIAGNOSIS — O285 Abnormal chromosomal and genetic finding on antenatal screening of mother: Secondary | ICD-10-CM | POA: Diagnosis present

## 2017-12-01 DIAGNOSIS — Z8632 Personal history of gestational diabetes: Secondary | ICD-10-CM

## 2017-12-01 DIAGNOSIS — Z3483 Encounter for supervision of other normal pregnancy, third trimester: Secondary | ICD-10-CM | POA: Diagnosis present

## 2017-12-01 DIAGNOSIS — Z603 Acculturation difficulty: Secondary | ICD-10-CM | POA: Diagnosis present

## 2017-12-01 LAB — CBC
HCT: 37.2 % (ref 36.0–46.0)
Hemoglobin: 12.5 g/dL (ref 12.0–15.0)
MCH: 28.9 pg (ref 26.0–34.0)
MCHC: 33.6 g/dL (ref 30.0–36.0)
MCV: 85.9 fL (ref 78.0–100.0)
PLATELETS: 237 10*3/uL (ref 150–400)
RBC: 4.33 MIL/uL (ref 3.87–5.11)
RDW: 14.6 % (ref 11.5–15.5)
WBC: 9.9 10*3/uL (ref 4.0–10.5)

## 2017-12-01 LAB — COMPREHENSIVE METABOLIC PANEL
ALBUMIN: 3 g/dL — AB (ref 3.5–5.0)
ALK PHOS: 79 U/L (ref 38–126)
ALT: 13 U/L (ref 0–44)
AST: 20 U/L (ref 15–41)
Anion gap: 11 (ref 5–15)
BILIRUBIN TOTAL: 0.5 mg/dL (ref 0.3–1.2)
BUN: 10 mg/dL (ref 6–20)
CALCIUM: 8.5 mg/dL — AB (ref 8.9–10.3)
CO2: 19 mmol/L — ABNORMAL LOW (ref 22–32)
Chloride: 103 mmol/L (ref 98–111)
Creatinine, Ser: 0.49 mg/dL (ref 0.44–1.00)
GFR calc Af Amer: >60 mL/min (ref >60)
GLUCOSE: 105 mg/dL — AB (ref 70–99)
POTASSIUM: 4 mmol/L (ref 3.5–5.1)
Sodium: 133 mmol/L — ABNORMAL LOW (ref 135–145)
TOTAL PROTEIN: 6.7 g/dL (ref 6.5–8.1)

## 2017-12-01 LAB — TYPE AND SCREEN
ABO/RH(D): A POS
ANTIBODY SCREEN: NEGATIVE

## 2017-12-01 LAB — ABO/RH: ABO/RH(D): A POS

## 2017-12-01 LAB — RPR: RPR: NONREACTIVE

## 2017-12-01 MED ORDER — METHYLERGONOVINE MALEATE 0.2 MG PO TABS
0.2000 mg | ORAL_TABLET | Freq: Three times a day (TID) | ORAL | Status: DC
  Administered 2017-12-01 – 2017-12-02 (×3): 0.2 mg via ORAL
  Filled 2017-12-01 (×3): qty 1

## 2017-12-01 MED ORDER — FENTANYL CITRATE (PF) 100 MCG/2ML IJ SOLN
100.0000 ug | INTRAMUSCULAR | Status: DC | PRN
  Administered 2017-12-01: 100 ug via INTRAVENOUS
  Filled 2017-12-01: qty 2

## 2017-12-01 MED ORDER — ACETAMINOPHEN 325 MG PO TABS: 650.0000 mg | ORAL_TABLET | ORAL | Status: DC | PRN

## 2017-12-01 MED ORDER — LACTATED RINGERS IV SOLN
INTRAVENOUS | Status: DC
  Administered 2017-12-01: 04:00:00 via INTRAVENOUS

## 2017-12-01 MED ORDER — ONDANSETRON HCL 4 MG/2ML IJ SOLN: 4.0000 mg | INTRAMUSCULAR | Status: DC | PRN

## 2017-12-01 MED ORDER — PRENATAL MULTIVITAMIN CH
1.0000 | ORAL_TABLET | Freq: Every day | ORAL | Status: DC
  Administered 2017-12-01 – 2017-12-02 (×2): 1 via ORAL
  Filled 2017-12-01 (×2): qty 1

## 2017-12-01 MED ORDER — FENTANYL CITRATE (PF) 100 MCG/2ML IJ SOLN: 50.0000 ug | Freq: Once | INTRAMUSCULAR | Status: DC

## 2017-12-01 MED ORDER — SENNOSIDES-DOCUSATE SODIUM 8.6-50 MG PO TABS
2.0000 | ORAL_TABLET | ORAL | Status: DC
  Administered 2017-12-01 – 2017-12-02 (×2): 2 via ORAL
  Filled 2017-12-01 (×2): qty 2

## 2017-12-01 MED ORDER — METHYLERGONOVINE MALEATE 0.2 MG/ML IJ SOLN
0.2000 mg | INTRAMUSCULAR | Status: DC | PRN
  Administered 2017-12-01: 0.2 mg via INTRAMUSCULAR

## 2017-12-01 MED ORDER — IBUPROFEN 600 MG PO TABS
600.0000 mg | ORAL_TABLET | Freq: Four times a day (QID) | ORAL | Status: DC
  Administered 2017-12-01 – 2017-12-03 (×8): 600 mg via ORAL
  Filled 2017-12-01 (×9): qty 1

## 2017-12-01 MED ORDER — OXYCODONE HCL 5 MG PO TABS: 10.0000 mg | ORAL_TABLET | Freq: Once | ORAL | Status: DC

## 2017-12-01 MED ORDER — SIMETHICONE 80 MG PO CHEW: 80.0000 mg | CHEWABLE_TABLET | ORAL | Status: DC | PRN

## 2017-12-01 MED ORDER — OXYTOCIN 40 UNITS IN LACTATED RINGERS INFUSION - SIMPLE MED
2.5000 [IU]/h | INTRAVENOUS | Status: DC
  Administered 2017-12-01: 2.5 [IU]/h via INTRAVENOUS

## 2017-12-01 MED ORDER — TRANEXAMIC ACID 1000 MG/10ML IV SOLN
1000.0000 mg | Freq: Once | INTRAVENOUS | Status: AC
  Administered 2017-12-01: 1000 mg via INTRAVENOUS
  Filled 2017-12-01: qty 1100

## 2017-12-01 MED ORDER — BENZOCAINE-MENTHOL 20-0.5 % EX AERO: 1.0000 "application " | INHALATION_SPRAY | CUTANEOUS | Status: DC | PRN

## 2017-12-01 MED ORDER — TETANUS-DIPHTH-ACELL PERTUSSIS 5-2.5-18.5 LF-MCG/0.5 IM SUSP: 0.5000 mL | Freq: Once | INTRAMUSCULAR | Status: DC

## 2017-12-01 MED ORDER — METHYLERGONOVINE MALEATE 0.2 MG/ML IJ SOLN: 0.2000 mg | Freq: Three times a day (TID) | INTRAMUSCULAR | Status: DC

## 2017-12-01 MED ORDER — COCONUT OIL OIL: 1.0000 "application " | TOPICAL_OIL | Status: DC | PRN

## 2017-12-01 MED ORDER — LACTATED RINGERS IV SOLN: 500.0000 mL | INTRAVENOUS | Status: DC | PRN

## 2017-12-01 MED ORDER — LIDOCAINE HCL (PF) 1 % IJ SOLN
30.0000 mL | INTRAMUSCULAR | Status: DC | PRN
  Filled 2017-12-01: qty 30

## 2017-12-01 MED ORDER — METHYLERGONOVINE MALEATE 0.2 MG PO TABS: 0.2000 mg | ORAL_TABLET | ORAL | Status: DC | PRN

## 2017-12-01 MED ORDER — MISOPROSTOL 200 MCG PO TABS
1000.0000 ug | ORAL_TABLET | Freq: Once | ORAL | Status: AC
  Administered 2017-12-01: 1000 ug via RECTAL

## 2017-12-01 MED ORDER — OXYCODONE-ACETAMINOPHEN 5-325 MG PO TABS: 2.0000 | ORAL_TABLET | ORAL | Status: DC | PRN

## 2017-12-01 MED ORDER — OXYTOCIN 10 UNIT/ML IJ SOLN
INTRAMUSCULAR | Status: AC
  Filled 2017-12-01: qty 2

## 2017-12-01 MED ORDER — OXYTOCIN BOLUS FROM INFUSION
500.0000 mL | Freq: Once | INTRAVENOUS | Status: AC
  Administered 2017-12-01: 500 mL via INTRAVENOUS

## 2017-12-01 MED ORDER — OXYCODONE-ACETAMINOPHEN 5-325 MG PO TABS: 1.0000 | ORAL_TABLET | ORAL | Status: DC | PRN

## 2017-12-01 MED ORDER — ONDANSETRON HCL 4 MG/2ML IJ SOLN: 4.0000 mg | Freq: Four times a day (QID) | INTRAMUSCULAR | Status: DC | PRN

## 2017-12-01 MED ORDER — DIBUCAINE 1 % RE OINT: 1.0000 "application " | TOPICAL_OINTMENT | RECTAL | Status: DC | PRN

## 2017-12-01 MED ORDER — WITCH HAZEL-GLYCERIN EX PADS: 1.0000 "application " | MEDICATED_PAD | CUTANEOUS | Status: DC | PRN

## 2017-12-01 MED ORDER — METHYLERGONOVINE MALEATE 0.2 MG/ML IJ SOLN: 0.2000 mg | INTRAMUSCULAR | Status: DC | PRN

## 2017-12-01 MED ORDER — SOD CITRATE-CITRIC ACID 500-334 MG/5ML PO SOLN: 30.0000 mL | ORAL | Status: DC | PRN

## 2017-12-01 MED ORDER — OXYTOCIN 40 UNITS IN LACTATED RINGERS INFUSION - SIMPLE MED
INTRAVENOUS | Status: AC
  Filled 2017-12-01: qty 1000

## 2017-12-01 MED ORDER — ZOLPIDEM TARTRATE 5 MG PO TABS: 5.0000 mg | ORAL_TABLET | Freq: Every evening | ORAL | Status: DC | PRN

## 2017-12-01 MED ORDER — ONDANSETRON HCL 4 MG PO TABS: 4.0000 mg | ORAL_TABLET | ORAL | Status: DC | PRN

## 2017-12-01 MED ORDER — MISOPROSTOL 200 MCG PO TABS
ORAL_TABLET | ORAL | Status: AC
  Filled 2017-12-01: qty 5

## 2017-12-01 MED ORDER — LIDOCAINE HCL (PF) 1 % IJ SOLN
INTRAMUSCULAR | Status: AC
  Filled 2017-12-01: qty 30

## 2017-12-01 MED ORDER — DIPHENHYDRAMINE HCL 25 MG PO CAPS: 25.0000 mg | ORAL_CAPSULE | Freq: Four times a day (QID) | ORAL | Status: DC | PRN

## 2017-12-01 MED ORDER — FLEET ENEMA 7-19 GM/118ML RE ENEM: 1.0000 | ENEMA | RECTAL | Status: DC | PRN

## 2017-12-01 NOTE — Progress Notes (Signed)
Dr Zachery ConchFriedman notified of pt's admission and status. Aware Rim and BOWB. To 167 via stretcher.

## 2017-12-01 NOTE — Lactation Note (Signed)
This note was copied from a baby's chart. Lactation Consultation Note  Patient Name: Cassidy Gordon GNFAO'ZToday's Date: 12/01/2017 Reason for consult: Initial assessment;Early term 5937-38.6wks Mom speaks Arabic so video interpreter used.  Baby is 10 hours old and she has had one formula feed and one breastfeed.  Mom has short erect nipples.  She insists on using a nipple shield because she used one with her son.  Fitted with a 16 mm shield but baby very sleepy and showing no interest in feeding.  Explained to mom that baby is not hungry and we need to watch for feeding cues.  Baby placed in crib and mom will eat her lunch.  Encouraged to call for assist prn  Maternal Data Has patient been taught Hand Expression?: Yes Does the patient have breastfeeding experience prior to this delivery?: Yes  Feeding Feeding Type: Breast Fed Length of feed: 15 min  LATCH Score Latch: Too sleepy or reluctant, no latch achieved, no sucking elicited.  Audible Swallowing: None  Type of Nipple: Everted at rest and after stimulation(short)  Comfort (Breast/Nipple): Soft / non-tender  Hold (Positioning): Assistance needed to correctly position infant at breast and maintain latch.  LATCH Score: 5  Interventions Interventions: Assisted with latch;Adjust position;Support pillows  Lactation Tools Discussed/Used     Consult Status Consult Status: Follow-up Date: 12/02/17 Follow-up type: In-patient    Huston FoleyMOULDEN, Jaidon Sponsel S 12/01/2017, 2:39 PM

## 2017-12-01 NOTE — Progress Notes (Signed)
0800 Fundal check (FF, EVEN, boggy x1, meds reviewed) with CNM DLawson. Order approved to move pt to 137 if stable around 0830. VO for methergine po TID. Saline lock for floor and void in bathroom as needed.

## 2017-12-01 NOTE — MAU Note (Signed)
Pt presents to MAU w/ painful contractions every 2-3 minutes for 2 hours. Pt vomited X1. SVE per Rushie NyhanJ Barham RN 9.5 cm. FHT's 150's per EFM.

## 2017-12-01 NOTE — Progress Notes (Signed)
0730 Interpreter [id number] utilized to review pt's bleeding episode, to explain bleeding is stabilized, to review pain, offer pain relief options, review diet, discuss baby care as baby returns to room, remind pt of safety and not to get out of bed, call bell reviewed.

## 2017-12-01 NOTE — H&P (Signed)
LABOR AND DELIVERY ADMISSION HISTORY AND PHYSICAL NOTE  Cassidy Gordon is a 32 y.o. female G2P1001 with IUP at 73w3dpresenting in advanced labor. She reports positive fetal movement. She denies leakage of fluid or vaginal bleeding.  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 Gestational diabetes     Past Surgical History: Past Surgical History:  Procedure Laterality Date  . NO PAST SURGERIES      Obstetrical History: OB History    Gravida  2   Para  1   Term  1   Preterm      AB      Living  1     SAB      TAB      Ectopic      Multiple      Live Births  1           Social History: Social History   Socioeconomic History  . Marital status: Married    Spouse name: Not on file  . Number of children: Not on file  . Years of education: Not on file  . Highest education level: Not on file  Occupational History  . Not on file  Social Needs  . Financial resource strain: Not on file  . Food insecurity:    Worry: Not on file    Inability: Not on file  . Transportation needs:    Medical: Not on file    Non-medical: Not on file  Tobacco Use  . Smoking status: Never Smoker  . Smokeless tobacco: Never Used  Substance and Sexual Activity  . Alcohol use: No    Frequency: Never  . Drug use: No  . Sexual activity: Yes    Birth control/protection: None  Lifestyle  . Physical activity:    Days per week: Not on file    Minutes per session: Not on file  . Stress: Not on file  Relationships  . Social connections:    Talks on phone: Not on file    Gets together: Not on file    Attends religious service: Not on file    Active member of club or organization: Not on file    Attends meetings of clubs or organizations: Not on file    Relationship status: Not on file  Other  Topics Concern  . Not on file  Social History Narrative  . Not on file    Family History: Family History  Problem Relation Age of Onset  . Diabetes Mother   . Diabetes Father   . Diabetes Sister   . Diabetes Brother     Allergies: No Known Allergies  Medications Prior to Admission  Medication Sig Dispense Refill Last Dose  . aspirin 81 MG chewable tablet Chew 1 tablet (81 mg total) by mouth daily. (Patient not taking: Reported on 11/01/2017) 30 tablet 11 Not Taking  . Blood Glucose Monitoring Suppl (ONETOUCH VERIO) w/Device KIT 1 each by Does not apply route 4 (four) times daily. (Patient not taking: Reported on 11/01/2017) 1 kit 0 Not Taking  . folic acid (FOLVITE) 1 MG tablet Take 1 tablet (1 mg total) by mouth daily. (Patient not taking: Reported on 11/01/2017) 30 tablet 3 Not Taking  . FOLIC ACID PO Take by mouth.   11/24/2017 at Unknown time  . glucose blood (ONETOUCH VERIO) test strip  Use as instructed (Patient not taking: Reported on 11/01/2017) 100 each 12 Not Taking  . ONETOUCH DELICA LANCETS 42P MISC 1 each by Does not apply route 4 (four) times daily. (Patient not taking: Reported on 11/01/2017) 100 each 12 Not Taking  . Prenatal Multivit-Min-Fe-FA (PRENATAL VITAMINS) 0.8 MG tablet Take 1 tablet by mouth daily. (Patient not taking: Reported on 11/01/2017) 30 tablet 12 Not Taking  . Prenatal Vit-Fe Fumarate-FA (PRENATAL VITAMIN PO) Take by mouth.   11/24/2017 at Unknown time  . ranitidine (ZANTAC) 150 MG tablet Take 1 tablet (150 mg total) by mouth 2 (two) times daily. 60 tablet 2 11/24/2017 at Unknown time     Review of Systems  All systems reviewed and negative except as stated in HPI  Physical Exam Blood pressure (!) 135/108, pulse 72, last menstrual period 03/07/2017. General appearance: alert, oriented, NAD Lungs: normal respiratory effort Heart: regular rate Abdomen: soft, non-tender; gravid, FH appropriate for GA Extremities: No calf swelling or tenderness Presentation:  cephalic Fetal monitoring: baseline rate 140, moderate variability, +acel, no decel Uterine activity: ctx q 1-2 min  SVE: Dilation: Lip/rim Effacement (%): 100 Station: 0 Exam by:: Blima Singer RNC  Prenatal labs: ABO, Rh: A/Positive/-- (01/28 1426) Antibody: Negative (01/28 1426) Rubella: 5.91 (01/28 1426) RPR: Non Reactive (04/24 0918)  HBsAg: Negative (01/28 1426)  HIV: Non Reactive (04/24 0918)  GC/Chlamydia: n/a GBS:   negative 2-hr GTT: positive Genetic screening: Elevated MSAFP -> 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  Patient Active Problem List   Diagnosis Date Noted  . Normal labor 12/01/2017  . Gestational diabetes mellitus (GDM) affecting pregnancy, antepartum 10/18/2017  . Language barrier 08/19/2017  . Abnormal chromosomal and genetic finding on antenatal screening mother 07/22/2017  . Supervision of other normal pregnancy, antepartum 06/24/2017  . History of gestational diabetes in prior pregnancy, currently pregnant 06/24/2017    Assessment: Cassidy Gordon is a 32 y.o. G2P1001 at 52w3dhere for SOL,  #Labor: expectant management; advanced labor #Pain: Would like epidural, likely unable to get d/t advanced labor #FWB: Cat I #ID:  GBS  JJenne PaneDegele 12/01/2017, 4:20 AM

## 2017-12-02 ENCOUNTER — Encounter: Payer: PPO | Admitting: Family Medicine

## 2017-12-02 NOTE — Plan of Care (Signed)
Infant in the NICU at this time

## 2017-12-02 NOTE — Progress Notes (Signed)
Post Partum Day 1 Subjective: up ad lib, voiding and tolerating PO. Denies headache, lightheadedness, or cramping.  Objective: Blood pressure (!) 121/45, pulse 72, temperature 97.9 F (36.6 C), temperature source Oral, resp. rate 18, last menstrual period 03/07/2017, SpO2 100 %, unknown if currently breastfeeding.  Physical Exam:  General: alert and no distress Lochia: appropriate Uterine Fundus: firm Incision: n/a DVT Evaluation: No evidence of DVT seen on physical exam.  Vitals:   12/01/17 1357 12/01/17 1812 12/02/17 0201 12/02/17 0800  BP: 104/69 (!) 140/91 105/66 (!) 121/45  Pulse: 86 80 72 72  Resp: 17 18 18 18   Temp: 97.7 F (36.5 C) 98 F (36.7 C) 98.6 F (37 C) 97.9 F (36.6 C)  TempSrc: Oral Oral Oral Oral  SpO2:         Recent Labs    12/01/17 0415  HGB 12.5  HCT 37.2    Assessment/Plan: 32 year old G2P2 6962w3d PPD 1 s/p SVD.  Plan for discharge tomorrow Breastfeeding Contraception: none    LOS: 1 day   Candie MileLindsey R Shaiann Mcmanamon 12/02/2017, 10:51 AM

## 2017-12-03 ENCOUNTER — Ambulatory Visit: Payer: Self-pay

## 2017-12-03 MED ORDER — DOCUSATE SODIUM 50 MG PO CAPS: 50.0000 mg | ORAL_CAPSULE | Freq: Two times a day (BID) | ORAL | 0 refills | Status: AC | PRN

## 2017-12-03 MED ORDER — IBUPROFEN 600 MG PO TABS: 600.0000 mg | ORAL_TABLET | Freq: Four times a day (QID) | ORAL | 0 refills | Status: AC

## 2017-12-03 NOTE — Discharge Instructions (Signed)
Vaginal Delivery, Care After °Refer to this sheet in the next few weeks. These instructions provide you with information about caring for yourself after vaginal delivery. Your health care provider may also give you more specific instructions. Your treatment has been planned according to current medical practices, but problems sometimes occur. Call your health care provider if you have any problems or questions. °What can I expect after the procedure? °After vaginal delivery, it is common to have: °· Some bleeding from your vagina. °· Soreness in your abdomen, your vagina, and the area of skin between your vaginal opening and your anus (perineum). °· Pelvic cramps. °· Fatigue. ° °Follow these instructions at home: °Medicines °· Take over-the-counter and prescription medicines only as told by your health care provider. °· If you were prescribed an antibiotic medicine, take it as told by your health care provider. Do not stop taking the antibiotic until it is finished. °Driving ° °· Do not drive or operate heavy machinery while taking prescription pain medicine. °· Do not drive for 24 hours if you received a sedative. °Lifestyle °· Do not drink alcohol. This is especially important if you are breastfeeding or taking medicine to relieve pain. °· Do not use tobacco products, including cigarettes, chewing tobacco, or e-cigarettes. If you need help quitting, ask your health care provider. °Eating and drinking °· Drink at least 8 eight-ounce glasses of water every day unless you are told not to by your health care provider. If you choose to breastfeed your baby, you may need to drink more water than this. °· Eat high-fiber foods every day. These foods may help prevent or relieve constipation. High-fiber foods include: °? Whole grain cereals and breads. °? Brown rice. °? Beans. °? Fresh fruits and vegetables. °Activity °· Return to your normal activities as told by your health care provider. Ask your health care provider  what activities are safe for you. °· Rest as much as possible. Try to rest or take a nap when your baby is sleeping. °· Do not lift anything that is heavier than your baby or 10 lb (4.5 kg) until your health care provider says that it is safe. °· Talk with your health care provider about when you can engage in sexual activity. This may depend on your: °? Risk of infection. °? Rate of healing. °? Comfort and desire to engage in sexual activity. °Vaginal Care °· If you have an episiotomy or a vaginal tear, check the area every day for signs of infection. Check for: °? More redness, swelling, or pain. °? More fluid or blood. °? Warmth. °? Pus or a bad smell. °· Do not use tampons or douches until your health care provider says this is safe. °· Watch for any blood clots that may pass from your vagina. These may look like clumps of dark red, brown, or black discharge. °General instructions °· Keep your perineum clean and dry as told by your health care provider. °· Wear loose, comfortable clothing. °· Wipe from front to back when you use the toilet. °· Ask your health care provider if you can shower or take a bath. If you had an episiotomy or a perineal tear during labor and delivery, your health care provider may tell you not to take baths for a certain length of time. °· Wear a bra that supports your breasts and fits you well. °· If possible, have someone help you with household activities and help care for your baby for at least a few days after   you leave the hospital. °· Keep all follow-up visits for you and your baby as told by your health care provider. This is important. °Contact a health care provider if: °· You have: °? Vaginal discharge that has a bad smell. °? Difficulty urinating. °? Pain when urinating. °? A sudden increase or decrease in the frequency of your bowel movements. °? More redness, swelling, or pain around your episiotomy or vaginal tear. °? More fluid or blood coming from your episiotomy or  vaginal tear. °? Pus or a bad smell coming from your episiotomy or vaginal tear. °? A fever. °? A rash. °? Little or no interest in activities you used to enjoy. °? Questions about caring for yourself or your baby. °· Your episiotomy or vaginal tear feels warm to the touch. °· Your episiotomy or vaginal tear is separating or does not appear to be healing. °· Your breasts are painful, hard, or turn red. °· You feel unusually sad or worried. °· You feel nauseous or you vomit. °· You pass large blood clots from your vagina. If you pass a blood clot from your vagina, save it to show to your health care provider. Do not flush blood clots down the toilet without having your health care provider look at them. °· You urinate more than usual. °· You are dizzy or light-headed. °· You have not breastfed at all and you have not had a menstrual period for 12 weeks after delivery. °· You have stopped breastfeeding and you have not had a menstrual period for 12 weeks after you stopped breastfeeding. °Get help right away if: °· You have: °? Pain that does not go away or does not get better with medicine. °? Chest pain. °? Difficulty breathing. °? Blurred vision or spots in your vision. °? Thoughts about hurting yourself or your baby. °· You develop pain in your abdomen or in one of your legs. °· You develop a severe headache. °· You faint. °· You bleed from your vagina so much that you fill two sanitary pads in one hour. °This information is not intended to replace advice given to you by your health care provider. Make sure you discuss any questions you have with your health care provider. °Document Released: 05/11/2000 Document Revised: 10/26/2015 Document Reviewed: 05/29/2015 °Elsevier Interactive Patient Education © 2018 [employer] ° ° °Breastfeeding °Choosing to breastfeed is one of the best decisions you can make for yourself and your baby. A change in hormones during pregnancy causes your breasts to make breast milk in  your milk-producing glands. Hormones prevent breast milk from being released before your baby is born. They also prompt milk flow after birth. Once breastfeeding has begun, thoughts of your baby, as well as his or her sucking or crying, can stimulate the release of milk from your milk-producing glands. °Benefits of breastfeeding °Research shows that breastfeeding offers many health benefits for infants and mothers. It also offers a cost-free and convenient way to feed your baby. °For your baby °· Your first milk (colostrum) helps your baby's digestive system to function better. °· Special cells in your milk (antibodies) help your baby to fight off infections. °· Breastfed babies are less likely to develop asthma, allergies, obesity, or type 2 diabetes. They are also at lower risk for sudden infant death syndrome (SIDS). °· Nutrients in breast milk are better able to meet your baby’s needs compared to infant formula. °· Breast milk improves your baby's brain development. °For you °· Breastfeeding helps to create a   very special bond between you and your baby. °· Breastfeeding is convenient. Breast milk costs nothing and is always available at the correct temperature. °· Breastfeeding helps to burn calories. It helps you to lose the weight that you gained during pregnancy. °· Breastfeeding makes your uterus return faster to its size before pregnancy. It also slows bleeding (lochia) after you give birth. °· Breastfeeding helps to lower your risk of developing type 2 diabetes, osteoporosis, rheumatoid arthritis, cardiovascular disease, and breast, ovarian, uterine, and endometrial cancer later in life. °Breastfeeding basics °Starting breastfeeding °· Find a comfortable place to sit or lie down, with your neck and back well-supported. °· Place a pillow or a rolled-up blanket under your baby to bring him or her to the level of your breast (if you are seated). Nursing pillows are specially designed to help support your arms  and your baby while you breastfeed. °· Make sure that your baby's tummy (abdomen) is facing your abdomen. °· Gently massage your breast. With your fingertips, massage from the outer edges of your breast inward toward the nipple. This encourages milk flow. If your milk flows slowly, you may need to continue this action during the feeding. °· Support your breast with 4 fingers underneath and your thumb above your nipple (make the letter "C" with your hand). Make sure your fingers are well away from your nipple and your baby’s mouth. °· Stroke your baby's lips gently with your finger or nipple. °· When your baby's mouth is open wide enough, quickly bring your baby to your breast, placing your entire nipple and as much of the areola as possible into your baby's mouth. The areola is the colored area around your nipple. °? More areola should be visible above your baby's upper lip than below the lower lip. °? Your baby's lips should be opened and extended outward (flanged) to ensure an adequate, comfortable latch. °? Your baby's tongue should be between his or her lower gum and your breast. °· Make sure that your baby's mouth is correctly positioned around your nipple (latched). Your baby's lips should create a seal on your breast and be turned out (everted). °· It is common for your baby to suck about 2-3 minutes in order to start the flow of breast milk. °Latching °Teaching your baby how to latch onto your breast properly is very important. An improper latch can cause nipple pain, decreased milk supply, and poor weight gain in your baby. Also, if your baby is not latched onto your nipple properly, he or she may swallow some air during feeding. This can make your baby fussy. Burping your baby when you switch breasts during the feeding can help to get rid of the air. However, teaching your baby to latch on properly is still the best way to prevent fussiness from swallowing air while breastfeeding. °Signs that your baby has  successfully latched onto your nipple °· Silent tugging or silent sucking, without causing you pain. Infant's lips should be extended outward (flanged). °· Swallowing heard between every 3-4 sucks once your milk has started to flow (after your let-down milk reflex occurs). °· Muscle movement above and in front of his or her ears while sucking. ° °Signs that your baby has not successfully latched onto your nipple °· Sucking sounds or smacking sounds from your baby while breastfeeding. °· Nipple pain. ° °If you think your baby has not latched on correctly, slip your finger into the corner of your baby’s mouth to break the suction and place   it between your baby's gums. Attempt to start breastfeeding again. °Signs of successful breastfeeding °Signs from your baby °· Your baby will gradually decrease the number of sucks or will completely stop sucking. °· Your baby will fall asleep. °· Your baby's body will relax. °· Your baby will retain a small amount of milk in his or her mouth. °· Your baby will let go of your breast by himself or herself. ° °Signs from you °· Breasts that have increased in firmness, weight, and size 1-3 hours after feeding. °· Breasts that are softer immediately after breastfeeding. °· Increased milk volume, as well as a change in milk consistency and color by the fifth day of breastfeeding. °· Nipples that are not sore, cracked, or bleeding. ° °Signs that your baby is getting enough milk °· Wetting at least 1-2 diapers during the first 24 hours after birth. °· Wetting at least 5-6 diapers every 24 hours for the first week after birth. The urine should be clear or pale yellow by the age of 5 days. °· Wetting 6-8 diapers every 24 hours as your baby continues to grow and develop. °· At least 3 stools in a 24-hour period by the age of 5 days. The stool should be soft and yellow. °· At least 3 stools in a 24-hour period by the age of 7 days. The stool should be seedy and yellow. °· No loss of weight  greater than 10% of birth weight during the first 3 days of life. °· Average weight gain of 4-7 oz (113-198 g) per week after the age of 4 days. °· Consistent daily weight gain by the age of 5 days, without weight loss after the age of 2 weeks. °After a feeding, your baby may spit up a small amount of milk. This is normal. °Breastfeeding frequency and duration °Frequent feeding will help you make more milk and can prevent sore nipples and extremely full breasts (breast engorgement). Breastfeed when you feel the need to reduce the fullness of your breasts or when your baby shows signs of hunger. This is called "breastfeeding on demand." Signs that your baby is hungry include: °· Increased alertness, activity, or restlessness. °· Movement of the head from side to side. °· Opening of the mouth when the corner of the mouth or cheek is stroked (rooting). °· Increased sucking sounds, smacking lips, cooing, sighing, or squeaking. °· Hand-to-mouth movements and sucking on fingers or hands. °· Fussing or crying. ° °Avoid introducing a pacifier to your baby in the first 4-6 weeks after your baby is born. After this time, you may choose to use a pacifier. Research has shown that pacifier use during the first year of a baby's life decreases the risk of sudden infant death syndrome (SIDS). °Allow your baby to feed on each breast as long as he or she wants. When your baby unlatches or falls asleep while feeding from the first breast, offer the second breast. Because newborns are often sleepy in the first few weeks of life, you may need to awaken your baby to get him or her to feed. °Breastfeeding times will vary from baby to baby. However, the following rules can serve as a guide to help you make sure that your baby is properly fed: °· Newborns (babies 4 weeks of age or younger) may breastfeed every 1-3 hours. °· Newborns should not go without breastfeeding for longer than 3 hours during the day or 5 hours during the  night. °· You should breastfeed your baby   a minimum of 8 times in a 24-hour period. ° °Breast milk pumping °Pumping and storing breast milk allows you to make sure that your baby is exclusively fed your breast milk, even at times when you are unable to breastfeed. This is especially important if you go back to work while you are still breastfeeding, or if you are not able to be present during feedings. Your lactation consultant can help you find a method of pumping that works best for you and give you guidelines about how long it is safe to store breast milk. °Caring for your breasts while you breastfeed °Nipples can become dry, cracked, and sore while breastfeeding. The following recommendations can help keep your breasts moisturized and healthy: °· Avoid using soap on your nipples. °· Wear a supportive bra designed especially for nursing. Avoid wearing underwire-style bras or extremely tight bras (sports bras). °· Air-dry your nipples for 3-4 minutes after each feeding. °· Use only cotton bra pads to absorb leaked breast milk. Leaking of breast milk between feedings is normal. °· Use lanolin on your nipples after breastfeeding. Lanolin helps to maintain your skin's normal moisture barrier. Pure lanolin is not harmful (not toxic) to your baby. You may also hand express a few drops of breast milk and gently massage that milk into your nipples and allow the milk to air-dry. ° °In the first few weeks after giving birth, some women experience breast engorgement. Engorgement can make your breasts feel heavy, warm, and tender to the touch. Engorgement peaks within 3-5 days after you give birth. The following recommendations can help to ease engorgement: °· Completely empty your breasts while breastfeeding or pumping. You may want to start by applying warm, moist heat (in the shower or with warm, water-soaked hand towels) just before feeding or pumping. This increases circulation and helps the milk flow. If your baby does  not completely empty your breasts while breastfeeding, pump any extra milk after he or she is finished. °· Apply ice packs to your breasts immediately after breastfeeding or pumping, unless this is too uncomfortable for you. To do this: °? Put ice in a plastic bag. °? Place a towel between your skin and the bag. °? Leave the ice on for 20 minutes, 2-3 times a day. °· Make sure that your baby is latched on and positioned properly while breastfeeding. ° °If engorgement persists after 48 hours of following these recommendations, contact your health care provider or a lactation consultant. °Overall health care recommendations while breastfeeding °· Eat 3 healthy meals and 3 snacks every day. Well-nourished mothers who are breastfeeding need an additional 450-500 calories a day. You can meet this requirement by increasing the amount of a balanced diet that you eat. °· Drink enough water to keep your urine pale yellow or clear. °· Rest often, relax, and continue to take your prenatal vitamins to prevent fatigue, stress, and low vitamin and mineral levels in your body (nutrient deficiencies). °· Do not use any products that contain nicotine or tobacco, such as cigarettes and e-cigarettes. Your baby may be harmed by chemicals from cigarettes that pass into breast milk and exposure to secondhand smoke. If you need help quitting, ask your health care provider. °· Avoid alcohol. °· Do not use illegal drugs or marijuana. °· Talk with your health care provider before taking any medicines. These include over-the-counter and prescription medicines as well as vitamins and herbal supplements. Some medicines that may be harmful to your baby can pass through breast milk. °·   It is possible to become pregnant while breastfeeding. If birth control is desired, ask your health care provider about options that will be safe while breastfeeding your baby. °Where to find more information: °[employer]: www.llli.org °Contact a  health care provider if: °· You feel like you want to stop breastfeeding or have become frustrated with breastfeeding. °· Your nipples are cracked or bleeding. °· Your breasts are red, tender, or warm. °· You have: °? Painful breasts or nipples. °? A swollen area on either breast. °? A fever or chills. °? Nausea or vomiting. °? Drainage other than breast milk from your nipples. °· Your breasts do not become full before feedings by the fifth day after you give birth. °· You feel sad and depressed. °· Your baby is: °? Too sleepy to eat well. °? Having trouble sleeping. °? More than 1 week old and wetting fewer than 6 diapers in a 24-hour period. °? Not gaining weight by 5 days of age. °· Your baby has fewer than 3 stools in a 24-hour period. °· Your baby's skin or the white parts of his or her eyes become yellow. °Get help right away if: °· Your baby is overly tired (lethargic) and does not want to wake up and feed. °· Your baby develops an unexplained fever. °Summary °· Breastfeeding offers many health benefits for infant and mothers. °· Try to breastfeed your infant when he or she shows early signs of hunger. °· Gently tickle or stroke your baby's lips with your finger or nipple to allow the baby to open his or her mouth. Bring the baby to your breast. Make sure that much of the areola is in your baby's mouth. Offer one side and burp the baby before you offer the other side. °· Talk with your health care provider or lactation consultant if you have questions or you face problems as you breastfeed. °This information is not intended to replace advice given to you by your health care provider. Make sure you discuss any questions you have with your health care provider. °Document Released: 05/14/2005 Document Revised: 06/15/2016 Document Reviewed: 06/15/2016 °Elsevier Interactive Patient Education © 2018 [employer] ° °

## 2017-12-03 NOTE — Lactation Note (Signed)
This note was copied from a baby's chart. Lactation Consultation Note: Arabic interpreter [facility] # [id number], infant is in NICU. Mother advised to continue to pump every 2-3 hours. Infant maybe discharged this afternoon. Mother reports that she plans to breastfeed infant exclusively.  Advised mother to do frequent skin to skin. Feed infant on cue and to feed infant at least 8-12 times in 24 hours. Lots of teaching done with mother. She is able to hand express. Discussed renting a electric pump from [employer]. Mother has a single pump at home and she was given a hand pump.  Mother very receptive to all teaching. Discussed treatment and prevention of engorgement.  Advised mother to [facility] with any breastfeeding concerns or questions.   Patient Name: Cassidy Gordon UEAVW'UToday's Date: 12/03/2017     Maternal Data    Feeding Feeding Type: Formula Nipple Type: Slow - flow Length of feed: 15 min  LATCH Score                   Interventions    Lactation Tools Discussed/Used     Consult Status      Michel BickersKendrick, Alexxander Kurt McCoy 12/03/2017, 4:49 PM

## 2017-12-03 NOTE — Progress Notes (Signed)
Breakfast ordered with ipad interpreter [phone]#[id number]. Sherald BargeMatthews, Luceil Herrin L

## 2017-12-03 NOTE — Progress Notes (Signed)
Discharge instructions reviewed with patient using interpreter # [id number], acknowledged any questions or concerns.

## 2017-12-03 NOTE — Discharge Summary (Addendum)
OB Discharge Summary    Patient Name: Cassidy Gordon DOB: 25-Nov-1985 MRN: [id number]  Date of admission: 12/01/2017 Delivering MD: Mickel Duhamel L   Date of discharge: 12/03/2017  Admitting diagnosis: 38wks, contractions Intrauterine pregnancy: [redacted]w[redacted]d    Secondary diagnosis:  Principal Problem:   Normal labor Active Problems:   Supervision of other normal pregnancy, antepartum   Abnormal chromosomal and genetic finding on antenatal screening mother   Language barrier   Gestational diabetes mellitus (GDM) affecting pregnancy, antepartum   Postpartum hemorrhage  Additional problems: N/A     Discharge diagnosis: Term Pregnancy Delivered, GDM A1 and PPH (postpartum hemorrhage)                                                                                                Post partum procedures: Cytotec, methergine, TXA for PPH  Augmentation: AROM  Complications: Postpartum hemorrhage >5061m Hospital course:  Onset of Labor With Vaginal Delivery     [age].o. yo [id number] 3864w3ds admitted in advanced Active Labor on 12/01/2017. Patient had a labor course as follows:  Membrane Rupture Time/Date: 4:24 AM ,12/01/2017   Intrapartum Procedures: Episiotomy: None [1]                                         Lacerations:  None [1]  Patient had a delivery of a Viable infant with PPH with QBL 502m10mreated subsequently with cytotec, methergine, TXA. Tolerated well. 12/01/2017  Information for the patient's newborn:  AlqaTashiba, Timoney0[[id number]]livery Method: Vaginal, Spontaneous(Filed from Delivery Summary)   Pateint had an uncomplicated postpartum course.  She is ambulating, tolerating a regular diet, passing flatus, and urinating well. Patient is discharged home in stable condition on 12/03/17.  Physical exam  Vitals:   12/02/17 0800 12/02/17 1320 12/02/17 2250 12/03/17 0700  BP: (!) 121/45 104/71 101/74 (!) 89/57  Pulse: 72 89 79 79  Resp: 18 18 20 18   Temp: 97.9 F  (36.6 C) 97.8 F (36.6 C) 98.6 F (37 C) 97.9 F (36.6 C)  TempSrc: Oral Oral Oral Oral  SpO2:  100%     General: alert, cooperative and no distress Lochia: appropriate Uterine Fundus: firm Incision: Dressing is clean, dry, and intact with minimal saturation DVT Evaluation: No evidence of DVT seen on physical exam. Labs: Lab Results  Component Value Date   WBC 9.9 12/01/2017   HGB 12.5 12/01/2017   HCT 37.2 12/01/2017   MCV 85.9 12/01/2017   PLT 237 12/01/2017   CMP Latest Ref Rng & Units 12/01/2017  Glucose 70 - 99 mg/dL 105(H)  BUN 6 - 20 mg/dL 10  Creatinine 0.44 - 1.00 mg/dL 0.49  Sodium 135 - 145 mmol/L 133(L)  Potassium 3.5 - 5.1 mmol/L 4.0  Chloride 98 - 111 mmol/L 103  CO2 22 - 32 mmol/L 19(L)  Calcium 8.9 - 10.3 mg/dL 8.5(L)  Total Protein 6.5 - 8.1 g/dL 6.7  Total Bilirubin 0.3 - 1.2 mg/dL 0.5  Alkaline Phos 38 -  126 U/L 79  AST 15 - 41 U/L 20  ALT 0 - 44 U/L 13    Discharge instruction: per After Visit Summary and "Baby and Me Booklet".  After visit meds:  Allergies as of 12/03/2017   No Known Allergies     Medication List    STOP taking these medications   ONETOUCH VERIO w/Device Kit     TAKE these medications   docusate sodium 50 MG capsule Commonly known as:  COLACE Take 1 capsule (50 mg total) by mouth 2 (two) times daily as needed for mild constipation.   ibuprofen 600 MG tablet Commonly known as:  ADVIL,MOTRIN Take 1 tablet (600 mg total) by mouth every 6 (six) hours.   PRENATAL VITAMIN PO Take by mouth.       Diet: routine diet  Activity: Advance as tolerated. Pelvic rest for 6 weeks.   Outpatient follow up:6 weeks with GTT Follow up Appt:No future appointments. Follow up Visit:No follow-ups on file.  Postpartum contraception: None  Newborn Data: Live born female  Birth Weight: 6 lb 1.9 oz (2775 g) APGAR: 9, 9  Newborn Delivery   Birth date/time:  12/01/2017 04:29:00 Delivery type:  Vaginal, Spontaneous     Baby  Feeding: Breast Disposition:NICU - planning for discharge later today.  12/03/2017 Rory Percy, DO   CNM attestation I have seen and examined this patient and agree with above documentation in the resident's note.   Cassidy Gordon is a 32 y.o. [id number] s/p SVD with PPH due to uterine atony.    Pain is well controlled.  Plan for birth control is no method.  Method of Feeding: breast (NICU infant)  PE:  BP (!) 89/57 (BP Location: Right Arm)   Pulse 79   Temp 97.9 F (36.6 C) (Oral)   Resp 18   LMP 03/07/2017 (Exact Date)   SpO2 100%   Breastfeeding? Unknown  Fundus firm  Recent Labs    12/01/17 0415  HGB 12.5  HCT 37.2     Plan: discharge today - postpartum care discussed - f/u clinic in 6 weeks for postpartum visit with GTT   Serita Grammes, CNM 10:51 AM  12/03/2017
